# Patient Record
Sex: Male | Born: 1935 | Race: White | Hispanic: No | Marital: Married | Smoking: Former smoker
Health system: Southern US, Community
[De-identification: ages and names within clinical notes are randomized; demographics above are authoritative.]

## PROBLEM LIST (undated history)

## (undated) DIAGNOSIS — F32A Depression, unspecified: Secondary | ICD-10-CM

## (undated) DIAGNOSIS — R2681 Unsteadiness on feet: Secondary | ICD-10-CM

## (undated) DIAGNOSIS — S7292XA Unspecified fracture of left femur, initial encounter for closed fracture: Secondary | ICD-10-CM

## (undated) DIAGNOSIS — K219 Gastro-esophageal reflux disease without esophagitis: Secondary | ICD-10-CM

## (undated) DIAGNOSIS — R279 Unspecified lack of coordination: Secondary | ICD-10-CM

## (undated) DIAGNOSIS — F329 Major depressive disorder, single episode, unspecified: Secondary | ICD-10-CM

## (undated) DIAGNOSIS — M810 Age-related osteoporosis without current pathological fracture: Secondary | ICD-10-CM

## (undated) DIAGNOSIS — K5900 Constipation, unspecified: Secondary | ICD-10-CM

## (undated) DIAGNOSIS — G40909 Epilepsy, unspecified, not intractable, without status epilepticus: Secondary | ICD-10-CM

## (undated) DIAGNOSIS — I639 Cerebral infarction, unspecified: Secondary | ICD-10-CM

## (undated) DIAGNOSIS — B1920 Unspecified viral hepatitis C without hepatic coma: Secondary | ICD-10-CM

## (undated) DIAGNOSIS — A499 Bacterial infection, unspecified: Secondary | ICD-10-CM

## (undated) DIAGNOSIS — M6281 Muscle weakness (generalized): Secondary | ICD-10-CM

## (undated) DIAGNOSIS — N400 Enlarged prostate without lower urinary tract symptoms: Secondary | ICD-10-CM

## (undated) DIAGNOSIS — D62 Acute posthemorrhagic anemia: Secondary | ICD-10-CM

## (undated) DIAGNOSIS — E871 Hypo-osmolality and hyponatremia: Secondary | ICD-10-CM

## (undated) DIAGNOSIS — E785 Hyperlipidemia, unspecified: Secondary | ICD-10-CM

## (undated) DIAGNOSIS — Z1612 Extended spectrum beta lactamase (ESBL) resistance: Secondary | ICD-10-CM

## (undated) DIAGNOSIS — Z8673 Personal history of transient ischemic attack (TIA), and cerebral infarction without residual deficits: Secondary | ICD-10-CM

## (undated) DIAGNOSIS — E46 Unspecified protein-calorie malnutrition: Secondary | ICD-10-CM

## (undated) HISTORY — DX: Hypo-osmolality and hyponatremia: E87.1

## (undated) HISTORY — DX: Age-related osteoporosis without current pathological fracture: M81.0

## (undated) HISTORY — DX: Hyperlipidemia, unspecified: E78.5

## (undated) HISTORY — DX: Gastro-esophageal reflux disease without esophagitis: K21.9

## (undated) HISTORY — DX: Benign prostatic hyperplasia without lower urinary tract symptoms: N40.0

## (undated) HISTORY — DX: Extended spectrum beta lactamase (ESBL) resistance: Z16.12

## (undated) HISTORY — DX: Bacterial infection, unspecified: A49.9

## (undated) HISTORY — DX: Depression, unspecified: F32.A

## (undated) HISTORY — DX: Major depressive disorder, single episode, unspecified: F32.9

## (undated) HISTORY — DX: Acute posthemorrhagic anemia: D62

## (undated) HISTORY — DX: Personal history of transient ischemic attack (TIA), and cerebral infarction without residual deficits: Z86.73

## (undated) HISTORY — DX: Constipation, unspecified: K59.00

## (undated) HISTORY — DX: Unspecified fracture of left femur, initial encounter for closed fracture: S72.92XA

## (undated) HISTORY — DX: Unspecified protein-calorie malnutrition: E46

---

## 2015-10-11 ENCOUNTER — Inpatient Hospital Stay (HOSPITAL_COMMUNITY): Payer: Medicare Other

## 2015-10-11 ENCOUNTER — Inpatient Hospital Stay (HOSPITAL_COMMUNITY): Payer: Medicare Other | Admitting: Anesthesiology

## 2015-10-11 ENCOUNTER — Emergency Department (HOSPITAL_COMMUNITY): Payer: Medicare Other

## 2015-10-11 ENCOUNTER — Encounter (HOSPITAL_COMMUNITY): Payer: Self-pay | Admitting: Emergency Medicine

## 2015-10-11 ENCOUNTER — Encounter (HOSPITAL_COMMUNITY)
Admission: EM | Disposition: A | Discharge status: Skilled Nursing Facility | Payer: Self-pay | Source: Home / Self Care | Attending: Internal Medicine

## 2015-10-11 ENCOUNTER — Inpatient Hospital Stay (HOSPITAL_COMMUNITY)
Admission: EM | Admit: 2015-10-11 | Discharge: 2015-10-16 | DRG: 467 | Disposition: A | Discharge status: Skilled Nursing Facility | Payer: Medicare Other | Attending: Internal Medicine | Admitting: Internal Medicine

## 2015-10-11 ENCOUNTER — Other Ambulatory Visit: Payer: Self-pay | Admitting: Physician Assistant

## 2015-10-11 DIAGNOSIS — I69354 Hemiplegia and hemiparesis following cerebral infarction affecting left non-dominant side: Secondary | ICD-10-CM | POA: Diagnosis not present

## 2015-10-11 DIAGNOSIS — S73005D Unspecified dislocation of left hip, subsequent encounter: Secondary | ICD-10-CM

## 2015-10-11 DIAGNOSIS — Y792 Prosthetic and other implants, materials and accessory orthopedic devices associated with adverse incidents: Secondary | ICD-10-CM | POA: Diagnosis not present

## 2015-10-11 DIAGNOSIS — Z419 Encounter for procedure for purposes other than remedying health state, unspecified: Secondary | ICD-10-CM

## 2015-10-11 DIAGNOSIS — D62 Acute posthemorrhagic anemia: Secondary | ICD-10-CM | POA: Diagnosis not present

## 2015-10-11 DIAGNOSIS — M419 Scoliosis, unspecified: Secondary | ICD-10-CM | POA: Diagnosis present

## 2015-10-11 DIAGNOSIS — Z66 Do not resuscitate: Secondary | ICD-10-CM | POA: Diagnosis present

## 2015-10-11 DIAGNOSIS — G40909 Epilepsy, unspecified, not intractable, without status epilepticus: Secondary | ICD-10-CM | POA: Diagnosis present

## 2015-10-11 DIAGNOSIS — Z87891 Personal history of nicotine dependence: Secondary | ICD-10-CM | POA: Diagnosis not present

## 2015-10-11 DIAGNOSIS — W06XXXA Fall from bed, initial encounter: Secondary | ICD-10-CM | POA: Diagnosis present

## 2015-10-11 DIAGNOSIS — S72002D Fracture of unspecified part of neck of left femur, subsequent encounter for closed fracture with routine healing: Secondary | ICD-10-CM | POA: Diagnosis not present

## 2015-10-11 DIAGNOSIS — T84029A Dislocation of unspecified internal joint prosthesis, initial encounter: Secondary | ICD-10-CM | POA: Diagnosis not present

## 2015-10-11 DIAGNOSIS — M549 Dorsalgia, unspecified: Secondary | ICD-10-CM | POA: Diagnosis present

## 2015-10-11 DIAGNOSIS — Z7982 Long term (current) use of aspirin: Secondary | ICD-10-CM

## 2015-10-11 DIAGNOSIS — F0390 Unspecified dementia without behavioral disturbance: Secondary | ICD-10-CM | POA: Diagnosis present

## 2015-10-11 DIAGNOSIS — S72012A Unspecified intracapsular fracture of left femur, initial encounter for closed fracture: Secondary | ICD-10-CM

## 2015-10-11 DIAGNOSIS — I700 Atherosclerosis of aorta: Secondary | ICD-10-CM | POA: Diagnosis present

## 2015-10-11 DIAGNOSIS — R339 Retention of urine, unspecified: Secondary | ICD-10-CM | POA: Diagnosis not present

## 2015-10-11 DIAGNOSIS — Z8673 Personal history of transient ischemic attack (TIA), and cerebral infarction without residual deficits: Secondary | ICD-10-CM

## 2015-10-11 DIAGNOSIS — B182 Chronic viral hepatitis C: Secondary | ICD-10-CM | POA: Diagnosis present

## 2015-10-11 DIAGNOSIS — M25562 Pain in left knee: Secondary | ICD-10-CM | POA: Diagnosis not present

## 2015-10-11 DIAGNOSIS — B1920 Unspecified viral hepatitis C without hepatic coma: Secondary | ICD-10-CM

## 2015-10-11 DIAGNOSIS — Z96642 Presence of left artificial hip joint: Secondary | ICD-10-CM

## 2015-10-11 DIAGNOSIS — W06XXXD Fall from bed, subsequent encounter: Secondary | ICD-10-CM | POA: Diagnosis not present

## 2015-10-11 DIAGNOSIS — M810 Age-related osteoporosis without current pathological fracture: Secondary | ICD-10-CM | POA: Diagnosis present

## 2015-10-11 DIAGNOSIS — M25552 Pain in left hip: Secondary | ICD-10-CM | POA: Diagnosis present

## 2015-10-11 DIAGNOSIS — S72002A Fracture of unspecified part of neck of left femur, initial encounter for closed fracture: Secondary | ICD-10-CM

## 2015-10-11 HISTORY — DX: Epilepsy, unspecified, not intractable, without status epilepticus: G40.909

## 2015-10-11 HISTORY — PX: TOTAL HIP ARTHROPLASTY: SHX124

## 2015-10-11 HISTORY — DX: Unspecified viral hepatitis C without hepatic coma: B19.20

## 2015-10-11 HISTORY — DX: Cerebral infarction, unspecified: I63.9

## 2015-10-11 LAB — PROTIME-INR
INR: 1.34 (ref 0.00–1.49)
Prothrombin Time: 16.7 seconds — ABNORMAL HIGH (ref 11.6–15.2)

## 2015-10-11 LAB — CBC WITH DIFFERENTIAL/PLATELET
BASOS ABS: 0 10*3/uL (ref 0.0–0.1)
BASOS PCT: 1 %
Eosinophils Absolute: 0.2 10*3/uL (ref 0.0–0.7)
Eosinophils Relative: 4 %
HCT: 37.6 % — ABNORMAL LOW (ref 39.0–52.0)
HEMOGLOBIN: 12.9 g/dL — AB (ref 13.0–17.0)
LYMPHS PCT: 46 %
Lymphs Abs: 2.5 10*3/uL (ref 0.7–4.0)
MCH: 32.3 pg (ref 26.0–34.0)
MCHC: 34.3 g/dL (ref 30.0–36.0)
MCV: 94.2 fL (ref 78.0–100.0)
MONO ABS: 0.6 10*3/uL (ref 0.1–1.0)
Monocytes Relative: 10 %
NEUTROS ABS: 2 10*3/uL (ref 1.7–7.7)
NEUTROS PCT: 39 %
Platelets: 150 10*3/uL (ref 150–400)
RBC: 3.99 MIL/uL — AB (ref 4.22–5.81)
RDW: 13.7 % (ref 11.5–15.5)
WBC: 5.3 10*3/uL (ref 4.0–10.5)

## 2015-10-11 LAB — BASIC METABOLIC PANEL
ANION GAP: 9 (ref 5–15)
BUN: 19 mg/dL (ref 6–20)
CALCIUM: 9.2 mg/dL (ref 8.9–10.3)
CO2: 24 mmol/L (ref 22–32)
Chloride: 102 mmol/L (ref 101–111)
Creatinine, Ser: 1.2 mg/dL (ref 0.61–1.24)
GFR, EST NON AFRICAN AMERICAN: 56 mL/min — AB (ref >60)
GLUCOSE: 107 mg/dL — AB (ref 65–99)
Potassium: 3.9 mmol/L (ref 3.5–5.1)
Sodium: 135 mmol/L (ref 135–145)

## 2015-10-11 LAB — TYPE AND SCREEN
ABO/RH(D): B POS
ANTIBODY SCREEN: NEGATIVE

## 2015-10-11 LAB — SURGICAL PCR SCREEN
MRSA, PCR: NEGATIVE
Staphylococcus aureus: NEGATIVE

## 2015-10-11 LAB — ABO/RH: ABO/RH(D): B POS

## 2015-10-11 LAB — HEPATIC FUNCTION PANEL
ALBUMIN: 3.1 g/dL — AB (ref 3.5–5.0)
ALT: 42 U/L (ref 17–63)
AST: 53 U/L — AB (ref 15–41)
Alkaline Phosphatase: 76 U/L (ref 38–126)
BILIRUBIN TOTAL: 1.3 mg/dL — AB (ref 0.3–1.2)
Bilirubin, Direct: 0.4 mg/dL (ref 0.1–0.5)
Indirect Bilirubin: 0.9 mg/dL (ref 0.3–0.9)
Total Protein: 6.7 g/dL (ref 6.5–8.1)

## 2015-10-11 SURGERY — ARTHROPLASTY, HIP, TOTAL, ANTERIOR APPROACH
Anesthesia: General | Site: Hip | Laterality: Left

## 2015-10-11 MED ORDER — PROPOFOL 1000 MG/100ML IV EMUL
INTRAVENOUS | Status: AC
  Filled 2015-10-11: qty 200

## 2015-10-11 MED ORDER — CEFAZOLIN SODIUM-DEXTROSE 2-4 GM/100ML-% IV SOLN
2.0000 g | Freq: Four times a day (QID) | INTRAVENOUS | Status: AC
  Administered 2015-10-11 – 2015-10-12 (×2): 2 g via INTRAVENOUS
  Filled 2015-10-11 (×2): qty 100

## 2015-10-11 MED ORDER — LACTATED RINGERS IV SOLN
INTRAVENOUS | Status: DC | PRN
  Administered 2015-10-11 (×2): via INTRAVENOUS

## 2015-10-11 MED ORDER — ATROPINE SULFATE 0.4 MG/ML IV SOSY
PREFILLED_SYRINGE | INTRAVENOUS | Status: AC
  Filled 2015-10-11: qty 2.5

## 2015-10-11 MED ORDER — CEFAZOLIN SODIUM-DEXTROSE 2-4 GM/100ML-% IV SOLN
2.0000 g | INTRAVENOUS | Status: AC
  Administered 2015-10-11: 2 g via INTRAVENOUS
  Filled 2015-10-11: qty 100

## 2015-10-11 MED ORDER — ONDANSETRON HCL 4 MG PO TABS
4.0000 mg | ORAL_TABLET | Freq: Four times a day (QID) | ORAL | Status: DC | PRN
  Administered 2015-10-14: 4 mg via ORAL
  Filled 2015-10-11: qty 1

## 2015-10-11 MED ORDER — ROCURONIUM BROMIDE 50 MG/5ML IV SOLN
INTRAVENOUS | Status: AC
  Filled 2015-10-11: qty 1

## 2015-10-11 MED ORDER — ENSURE ENLIVE PO LIQD
237.0000 mL | Freq: Every day | ORAL | Status: DC
  Administered 2015-10-14 – 2015-10-16 (×3): 237 mL via ORAL

## 2015-10-11 MED ORDER — TOPIRAMATE 25 MG PO TABS
50.0000 mg | ORAL_TABLET | Freq: Two times a day (BID) | ORAL | Status: DC
  Administered 2015-10-11 – 2015-10-16 (×11): 50 mg via ORAL
  Filled 2015-10-11 (×12): qty 2

## 2015-10-11 MED ORDER — METHOCARBAMOL 1000 MG/10ML IJ SOLN
500.0000 mg | Freq: Four times a day (QID) | INTRAVENOUS | Status: DC | PRN
  Filled 2015-10-11: qty 5

## 2015-10-11 MED ORDER — FENTANYL CITRATE (PF) 100 MCG/2ML IJ SOLN
25.0000 ug | INTRAMUSCULAR | Status: DC | PRN
  Administered 2015-10-11 (×2): 50 ug via INTRAVENOUS

## 2015-10-11 MED ORDER — PHENYLEPHRINE 40 MCG/ML (10ML) SYRINGE FOR IV PUSH (FOR BLOOD PRESSURE SUPPORT)
PREFILLED_SYRINGE | INTRAVENOUS | Status: AC
  Filled 2015-10-11: qty 10

## 2015-10-11 MED ORDER — SODIUM CHLORIDE 0.9% FLUSH
3.0000 mL | Freq: Two times a day (BID) | INTRAVENOUS | Status: DC
  Administered 2015-10-13 – 2015-10-16 (×6): 3 mL via INTRAVENOUS

## 2015-10-11 MED ORDER — SODIUM CHLORIDE 0.9 % IV SOLN
INTRAVENOUS | Status: DC
Start: 2015-10-11 — End: 2015-10-14
  Administered 2015-10-11 – 2015-10-12 (×2): via INTRAVENOUS

## 2015-10-11 MED ORDER — 0.9 % SODIUM CHLORIDE (POUR BTL) OPTIME
TOPICAL | Status: DC | PRN
  Administered 2015-10-11: 1000 mL

## 2015-10-11 MED ORDER — OXYCODONE-ACETAMINOPHEN 5-325 MG PO TABS
1.0000 | ORAL_TABLET | Freq: Once | ORAL | Status: AC
  Administered 2015-10-11: 1 via ORAL
  Filled 2015-10-11: qty 1

## 2015-10-11 MED ORDER — PROPOFOL 10 MG/ML IV BOLUS
INTRAVENOUS | Status: AC
Start: 2015-10-11 — End: 2015-10-11
  Filled 2015-10-11: qty 20

## 2015-10-11 MED ORDER — MENTHOL 3 MG MT LOZG: 1.0000 | LOZENGE | OROMUCOSAL | Status: DC | PRN

## 2015-10-11 MED ORDER — ASPIRIN EC 325 MG PO TBEC
325.0000 mg | DELAYED_RELEASE_TABLET | Freq: Every day | ORAL | Status: DC
  Administered 2015-10-12: 325 mg via ORAL
  Filled 2015-10-11: qty 1

## 2015-10-11 MED ORDER — PHENOL 1.4 % MT LIQD: 1.0000 | OROMUCOSAL | Status: DC | PRN

## 2015-10-11 MED ORDER — OXYCODONE-ACETAMINOPHEN 5-325 MG PO TABS: 1.0000 | ORAL_TABLET | ORAL | Status: DC | PRN

## 2015-10-11 MED ORDER — ONDANSETRON HCL 4 MG/2ML IJ SOLN: 4.0000 mg | Freq: Four times a day (QID) | INTRAMUSCULAR | Status: DC | PRN

## 2015-10-11 MED ORDER — HYDROCODONE-ACETAMINOPHEN 5-325 MG PO TABS
1.0000 | ORAL_TABLET | Freq: Four times a day (QID) | ORAL | Status: DC | PRN
  Administered 2015-10-11 – 2015-10-14 (×8): 2 via ORAL
  Filled 2015-10-11 (×8): qty 2

## 2015-10-11 MED ORDER — POVIDONE-IODINE 10 % EX SWAB
2.0000 "application " | Freq: Once | CUTANEOUS | Status: AC
  Administered 2015-10-11: 2 via TOPICAL

## 2015-10-11 MED ORDER — CITALOPRAM HYDROBROMIDE 10 MG PO TABS
10.0000 mg | ORAL_TABLET | Freq: Every day | ORAL | Status: DC
  Administered 2015-10-12 – 2015-10-15 (×4): 10 mg via ORAL
  Filled 2015-10-11 (×5): qty 1

## 2015-10-11 MED ORDER — MORPHINE SULFATE (PF) 2 MG/ML IV SOLN
0.5000 mg | INTRAVENOUS | Status: DC | PRN
  Administered 2015-10-12 (×2): 0.5 mg via INTRAVENOUS
  Filled 2015-10-11 (×2): qty 1

## 2015-10-11 MED ORDER — GLYCOPYRROLATE 0.2 MG/ML IJ SOLN
INTRAMUSCULAR | Status: DC | PRN
  Administered 2015-10-11: 0.6 mg via INTRAVENOUS

## 2015-10-11 MED ORDER — ZOLPIDEM TARTRATE 5 MG PO TABS
5.0000 mg | ORAL_TABLET | Freq: Every evening | ORAL | Status: DC | PRN
  Administered 2015-10-13 – 2015-10-14 (×2): 5 mg via ORAL
  Filled 2015-10-11 (×2): qty 1

## 2015-10-11 MED ORDER — FENTANYL CITRATE (PF) 100 MCG/2ML IJ SOLN
INTRAMUSCULAR | Status: AC
  Filled 2015-10-11: qty 2

## 2015-10-11 MED ORDER — MEPERIDINE HCL 25 MG/ML IJ SOLN: 6.2500 mg | INTRAMUSCULAR | Status: DC | PRN

## 2015-10-11 MED ORDER — FENTANYL CITRATE (PF) 250 MCG/5ML IJ SOLN
INTRAMUSCULAR | Status: AC
  Filled 2015-10-11: qty 5

## 2015-10-11 MED ORDER — ACETAMINOPHEN 325 MG PO TABS
650.0000 mg | ORAL_TABLET | Freq: Four times a day (QID) | ORAL | Status: DC | PRN
  Administered 2015-10-14: 650 mg via ORAL
  Filled 2015-10-11: qty 2

## 2015-10-11 MED ORDER — ACETAMINOPHEN 650 MG RE SUPP: 650.0000 mg | Freq: Four times a day (QID) | RECTAL | Status: DC | PRN

## 2015-10-11 MED ORDER — ACETAMINOPHEN 325 MG PO TABS: 650.0000 mg | ORAL_TABLET | Freq: Four times a day (QID) | ORAL | Status: DC | PRN

## 2015-10-11 MED ORDER — FENTANYL CITRATE (PF) 100 MCG/2ML IJ SOLN
INTRAMUSCULAR | Status: DC | PRN
  Administered 2015-10-11 (×2): 50 ug via INTRAVENOUS
  Administered 2015-10-11: 100 ug via INTRAVENOUS

## 2015-10-11 MED ORDER — NEOSTIGMINE METHYLSULFATE 10 MG/10ML IV SOLN
INTRAVENOUS | Status: DC | PRN
  Administered 2015-10-11: 5 mg via INTRAVENOUS

## 2015-10-11 MED ORDER — METOCLOPRAMIDE HCL 5 MG/ML IJ SOLN: 5.0000 mg | Freq: Three times a day (TID) | INTRAMUSCULAR | Status: DC | PRN

## 2015-10-11 MED ORDER — LACTATED RINGERS IV SOLN
INTRAVENOUS | Status: DC
  Administered 2015-10-11: 17:00:00 via INTRAVENOUS

## 2015-10-11 MED ORDER — ROCURONIUM BROMIDE 100 MG/10ML IV SOLN
INTRAVENOUS | Status: DC | PRN
  Administered 2015-10-11: 30 mg via INTRAVENOUS
  Administered 2015-10-11: 20 mg via INTRAVENOUS

## 2015-10-11 MED ORDER — METHOCARBAMOL 500 MG PO TABS
500.0000 mg | ORAL_TABLET | Freq: Four times a day (QID) | ORAL | Status: DC | PRN
  Administered 2015-10-11 – 2015-10-16 (×9): 500 mg via ORAL
  Filled 2015-10-11 (×9): qty 1

## 2015-10-11 MED ORDER — METOCLOPRAMIDE HCL 5 MG PO TABS: 5.0000 mg | ORAL_TABLET | Freq: Three times a day (TID) | ORAL | Status: DC | PRN

## 2015-10-11 MED ORDER — PROPOFOL 10 MG/ML IV BOLUS
INTRAVENOUS | Status: DC | PRN
  Administered 2015-10-11: 100 mg via INTRAVENOUS

## 2015-10-11 MED ORDER — PHENYLEPHRINE HCL 10 MG/ML IJ SOLN
INTRAMUSCULAR | Status: DC | PRN
  Administered 2015-10-11 (×3): 80 ug via INTRAVENOUS

## 2015-10-11 MED ORDER — CHLORHEXIDINE GLUCONATE 4 % EX LIQD: 60.0000 mL | Freq: Once | CUTANEOUS | Status: DC

## 2015-10-11 MED ORDER — SODIUM CHLORIDE 0.9% FLUSH: 3.0000 mL | INTRAVENOUS | Status: DC | PRN

## 2015-10-11 MED ORDER — ATORVASTATIN CALCIUM 10 MG PO TABS
10.0000 mg | ORAL_TABLET | Freq: Every day | ORAL | Status: DC
  Administered 2015-10-11 – 2015-10-15 (×5): 10 mg via ORAL
  Filled 2015-10-11 (×5): qty 1

## 2015-10-11 MED ORDER — SODIUM CHLORIDE 0.9 % IR SOLN
Status: DC | PRN
  Administered 2015-10-11: 3000 mL

## 2015-10-11 MED ORDER — ONDANSETRON HCL 4 MG/2ML IJ SOLN
INTRAMUSCULAR | Status: DC | PRN
  Administered 2015-10-11: 4 mg via INTRAVENOUS

## 2015-10-11 MED ORDER — TAMSULOSIN HCL 0.4 MG PO CAPS
0.4000 mg | ORAL_CAPSULE | Freq: Every day | ORAL | Status: DC
  Administered 2015-10-12 – 2015-10-15 (×4): 0.4 mg via ORAL
  Filled 2015-10-11 (×4): qty 1

## 2015-10-11 SURGICAL SUPPLY — 52 items
BENZOIN TINCTURE PRP APPL 2/3 (GAUZE/BANDAGES/DRESSINGS) ×3 IMPLANT
BLADE SAW SGTL 18X1.27X75 (BLADE) ×2 IMPLANT
BLADE SAW SGTL 18X1.27X75MM (BLADE) ×1
BLADE SURG ROTATE 9660 (MISCELLANEOUS) IMPLANT
CAPT HIP TOTAL 2 ×3 IMPLANT
CELLS DAT CNTRL 66122 CELL SVR (MISCELLANEOUS) ×1 IMPLANT
CLOSURE WOUND 1/2 X4 (GAUZE/BANDAGES/DRESSINGS) ×2
COVER SURGICAL LIGHT HANDLE (MISCELLANEOUS) ×3 IMPLANT
DRAPE C-ARM 42X72 X-RAY (DRAPES) ×3 IMPLANT
DRAPE STERI IOBAN 125X83 (DRAPES) ×3 IMPLANT
DRAPE U-SHAPE 47X51 STRL (DRAPES) ×9 IMPLANT
DRESSING ALLEVYN LIFE SACRUM (GAUZE/BANDAGES/DRESSINGS) ×3 IMPLANT
DRSG AQUACEL AG ADV 3.5X10 (GAUZE/BANDAGES/DRESSINGS) ×3 IMPLANT
DURAPREP 26ML APPLICATOR (WOUND CARE) ×3 IMPLANT
ELECT BLADE 4.0 EZ CLEAN MEGAD (MISCELLANEOUS) ×3
ELECT BLADE 6.5 EXT (BLADE) IMPLANT
ELECT REM PT RETURN 9FT ADLT (ELECTROSURGICAL) ×3
ELECTRODE BLDE 4.0 EZ CLN MEGD (MISCELLANEOUS) ×1 IMPLANT
ELECTRODE REM PT RTRN 9FT ADLT (ELECTROSURGICAL) ×1 IMPLANT
FACESHIELD WRAPAROUND (MASK) ×6 IMPLANT
GAUZE XEROFORM 1X8 LF (GAUZE/BANDAGES/DRESSINGS) ×3 IMPLANT
GLOVE BIOGEL PI IND STRL 8 (GLOVE) ×2 IMPLANT
GLOVE BIOGEL PI INDICATOR 8 (GLOVE) ×4
GLOVE ECLIPSE 8.0 STRL XLNG CF (GLOVE) ×3 IMPLANT
GLOVE ORTHO TXT STRL SZ7.5 (GLOVE) ×3 IMPLANT
GOWN STRL REUS W/ TWL LRG LVL3 (GOWN DISPOSABLE) ×2 IMPLANT
GOWN STRL REUS W/ TWL XL LVL3 (GOWN DISPOSABLE) ×2 IMPLANT
GOWN STRL REUS W/TWL LRG LVL3 (GOWN DISPOSABLE) ×4
GOWN STRL REUS W/TWL XL LVL3 (GOWN DISPOSABLE) ×4
HANDPIECE INTERPULSE COAX TIP (DISPOSABLE) ×2
KIT BASIN OR (CUSTOM PROCEDURE TRAY) ×3 IMPLANT
KIT ROOM TURNOVER OR (KITS) ×3 IMPLANT
MANIFOLD NEPTUNE II (INSTRUMENTS) ×3 IMPLANT
NS IRRIG 1000ML POUR BTL (IV SOLUTION) ×3 IMPLANT
PACK TOTAL JOINT (CUSTOM PROCEDURE TRAY) ×3 IMPLANT
PAD ARMBOARD 7.5X6 YLW CONV (MISCELLANEOUS) ×3 IMPLANT
RTRCTR WOUND ALEXIS 18CM MED (MISCELLANEOUS) ×3
SET HNDPC FAN SPRY TIP SCT (DISPOSABLE) ×1 IMPLANT
SPONGE LAP 18X18 X RAY DECT (DISPOSABLE) ×3 IMPLANT
STAPLER VISISTAT 35W (STAPLE) IMPLANT
STRIP CLOSURE SKIN 1/2X4 (GAUZE/BANDAGES/DRESSINGS) ×4 IMPLANT
SUT ETHIBOND NAB CT1 #1 30IN (SUTURE) ×3 IMPLANT
SUT MNCRL AB 4-0 PS2 18 (SUTURE) ×3 IMPLANT
SUT VIC AB 0 CT1 27 (SUTURE) ×2
SUT VIC AB 0 CT1 27XBRD ANBCTR (SUTURE) ×1 IMPLANT
SUT VIC AB 1 CT1 27 (SUTURE) ×2
SUT VIC AB 1 CT1 27XBRD ANBCTR (SUTURE) ×1 IMPLANT
SUT VIC AB 2-0 CT1 27 (SUTURE) ×2
SUT VIC AB 2-0 CT1 TAPERPNT 27 (SUTURE) ×1 IMPLANT
TOWEL OR 17X24 6PK STRL BLUE (TOWEL DISPOSABLE) ×3 IMPLANT
TOWEL OR 17X26 10 PK STRL BLUE (TOWEL DISPOSABLE) ×3 IMPLANT
TRAY FOLEY CATH 16FRSI W/METER (SET/KITS/TRAYS/PACK) IMPLANT

## 2015-10-11 NOTE — ED Notes (Signed)
[phone]. [id number]

## 2015-10-11 NOTE — ED Notes (Signed)
RN spoke with Dr. August Saucerean at [facility]. RN let him know daughter requesting Dr. Magnus IvanBlackman since she has history with him. He stated he let her know.

## 2015-10-11 NOTE — ED Provider Notes (Signed)
CSN: [id number]     Arrival date & time 10/11/15  0450 History   First MD Initiated Contact with Patient 10/11/15 0501     Chief Complaint  Patient presents with  . Fall     (Consider location/radiation/quality/duration/timing/severity/associated sxs/prior Treatment) Patient is a 81 y.o. male presenting with fall. The history is provided by the patient.  Fall  He was transferred here from a nursing home where he was reported to fallen out of bed. Patient states that he hit his head and is complaining of feeling dizzy and having head pain and neck pain. He is also complaining of pain in both of his legs. He states that he is actually at the nursing home for rehabilitation because of problems with his legs. Patient states that he asked she had a seizure. He denies nausea or vomiting. He states that [place] and [place] not [place] a physician here.  Past Medical History  Diagnosis Date  . Stroke [facility])    History reviewed. No pertinent past surgical history. History reviewed. No pertinent family history. Social History  Substance Use Topics  . Smoking status: Former Games developer  . Smokeless tobacco: None  . Alcohol Use: No    Review of Systems  All other systems reviewed and are negative.     Allergies  Review of patient's allergies indicates no known allergies.  Home Medications   Prior to Admission medications   Not on File   BP 128/90 mmHg  Pulse 85  Temp(Src) 97.6 F (36.4 C) (Oral)  Resp 18  SpO2 100% Physical Exam  Nursing note and vitals reviewed.  81 year old male, resting comfortably and in no acute distress. Vital signs are normal. Oxygen saturation is 100%, which is normal. Head is normocephalic and atraumatic. PERRLA, EOMI. Oropharynx is clear. Neck is nontender without adenopathy or JVD. Back is nontender and there is no CVA tenderness. Lungs are clear without rales, wheezes, or rhonchi. Chest is nontender. Heart has regular rate  and rhythm without murmur. Abdomen is soft, flat, nontender without masses or hepatosplenomegaly and peristalsis is normoactive. Extremities have no cyanosis or edema. While there is no tenderness on palpation of the hips, legs are rigid and range of motion is markedly decreased. No leg shortening or external or internal rotation. No obvious acute extremity injury. Skin is warm and dry without rash. Neurologic: Mental status is normal, cranial nerves are intact, there are no motor or sensory deficits.  ED Course  Procedures (including critical care time) Labs Review Results for orders placed or performed during the hospital encounter of 10/11/15  Basic metabolic panel  Result Value Ref Range   Sodium 135 135 - 145 mmol/L   Potassium 3.9 3.5 - 5.1 mmol/L   Chloride 102 101 - 111 mmol/L   CO2 24 22 - 32 mmol/L   Glucose, Bld 107 (H) 65 - 99 mg/dL   BUN 19 6 - 20 mg/dL   Creatinine, Ser 4.09 0.61 - 1.24 mg/dL   Calcium 9.2 8.9 - 81.1 mg/dL   GFR calc non Af Amer 56 (L) >60 mL/min   GFR calc Af Amer >60 >60 mL/min   Anion gap 9 5 - 15  CBC with Differential  Result Value Ref Range   WBC 5.3 4.0 - 10.5 K/uL   RBC 3.99 (L) 4.22 - 5.81 MIL/uL   Hemoglobin 12.9 (L) 13.0 - 17.0 g/dL   HCT 91.4 (L) 78.2 - 95.6 %   MCV  94.2 78.0 - 100.0 fL   MCH 32.3 26.0 - 34.0 pg   MCHC 34.3 30.0 - 36.0 g/dL   RDW 16.113.7 09.611.5 - 04.515.5 %   Platelets 150 150 - 400 K/uL   Neutrophils Relative % 39 %   Neutro Abs 2.0 1.7 - 7.7 K/uL   Lymphocytes Relative 46 %   Lymphs Abs 2.5 0.7 - 4.0 K/uL   Monocytes Relative 10 %   Monocytes Absolute 0.6 0.1 - 1.0 K/uL   Eosinophils Relative 4 %   Eosinophils Absolute 0.2 0.0 - 0.7 K/uL   Basophils Relative 1 %   Basophils Absolute 0.0 0.0 - 0.1 K/uL    Imaging Review Ct Head Wo Contrast  10/11/2015  CLINICAL DATA:  Patient fell out of bed. Now with head and neck pain. EXAM: CT HEAD WITHOUT CONTRAST CT CERVICAL SPINE WITHOUT CONTRAST TECHNIQUE: Multidetector CT  imaging of the head and cervical spine was performed following the standard protocol without intravenous contrast. Multiplanar CT image reconstructions of the cervical spine were also generated. COMPARISON:  None. FINDINGS: CT HEAD FINDINGS Diffuse cerebral atrophy. Ventricular dilatation consistent with central atrophy. Low-attenuation changes in the deep white matter consistent with small vessel ischemia. Prominent vascular calcifications. No mass effect or midline shift. No abnormal extra-axial fluid collections. Gray-white matter junctions are distinct. Basal cisterns are not effaced. No evidence of acute intracranial hemorrhage. No depressed skull fractures. Mucosal thickening in the right maxillary antrum. Mastoid air cells are not opacified. CT CERVICAL SPINE FINDINGS There is reversal of the usual cervical lordosis without anterior subluxation. This may be due to patient positioning or degenerative change but ligamentous injury or muscle spasm can also have this appearance and are not excluded. No vertebral compression deformities. Degenerative changes throughout the cervical spine with narrowed interspaces and endplate hypertrophic changes. Degenerative changes in the facet joints. Uncovertebral and facet joint spurring causes encroachment upon the neural foramina at multiple levels bilaterally. No prevertebral soft tissue swelling. No vertebral compression deformities. C1-2 articulation appears intact. Sclerosis in the right pedicle of C2 is likely to represent a benign bone island. Soft tissues are unremarkable. Vascular calcifications. IMPRESSION: No acute intracranial abnormalities. Prominent chronic atrophy and small vessel ischemic changes. Nonspecific reversal of the usual cervical lordosis. Diffuse degenerative changes in the cervical spine. No acute displaced fractures identified. Electronically Signed   By: Burman NievesWilliam  Stevens M.D.   On: 10/11/2015 06:34   Ct Cervical Spine Wo Contrast  10/11/2015   CLINICAL DATA:  Patient fell out of bed. Now with head and neck pain. EXAM: CT HEAD WITHOUT CONTRAST CT CERVICAL SPINE WITHOUT CONTRAST TECHNIQUE: Multidetector CT imaging of the head and cervical spine was performed following the standard protocol without intravenous contrast. Multiplanar CT image reconstructions of the cervical spine were also generated. COMPARISON:  None. FINDINGS: CT HEAD FINDINGS Diffuse cerebral atrophy. Ventricular dilatation consistent with central atrophy. Low-attenuation changes in the deep white matter consistent with small vessel ischemia. Prominent vascular calcifications. No mass effect or midline shift. No abnormal extra-axial fluid collections. Gray-white matter junctions are distinct. Basal cisterns are not effaced. No evidence of acute intracranial hemorrhage. No depressed skull fractures. Mucosal thickening in the right maxillary antrum. Mastoid air cells are not opacified. CT CERVICAL SPINE FINDINGS There is reversal of the usual cervical lordosis without anterior subluxation. This may be due to patient positioning or degenerative change but ligamentous injury or muscle spasm can also have this appearance and are not excluded. No vertebral compression deformities. Degenerative changes throughout  the cervical spine with narrowed interspaces and endplate hypertrophic changes. Degenerative changes in the facet joints. Uncovertebral and facet joint spurring causes encroachment upon the neural foramina at multiple levels bilaterally. No prevertebral soft tissue swelling. No vertebral compression deformities. C1-2 articulation appears intact. Sclerosis in the right pedicle of C2 is likely to represent a benign bone island. Soft tissues are unremarkable. Vascular calcifications. IMPRESSION: No acute intracranial abnormalities. Prominent chronic atrophy and small vessel ischemic changes. Nonspecific reversal of the usual cervical lordosis. Diffuse degenerative changes in the cervical  spine. No acute displaced fractures identified. Electronically Signed   By: Burman Nieves M.D.   On: 10/11/2015 06:34   Dg Hips Bilat With Pelvis 3-4 Views  10/11/2015  CLINICAL DATA:  Initial evaluation for acute seizure, fall. EXAM: DG HIP (WITH OR WITHOUT PELVIS) 3-4V BILAT COMPARISON:  None. FINDINGS: Bones are diffusely osteopenia, somewhat limiting evaluation. There is an acute subcapital fracture of through the left femoral neck with minimal displacement and slight impaction. Femoral head itself remains position within the acetabulum. Femoral head height preserved. Remainder of the visualized left femoral shaft intact. Bony acetabulum on the left is intact. Right hip intact. Right femoral head normally position within the acetabulum. Right femoral head and neck intact. Visualized proximal right femoral shaft intact. Visualized bony pelvis intact.  SI joints approximated. Mild degenerative changes noted within the lower lumbar spine. No acute soft tissue abnormality.  Vascular calcifications noted. IMPRESSION: 1. Acute subcapital fracture of the proximal left femoral neck with slight displacement and impaction. 2. No acute fracture or dislocation about the right hip. 3. Diffuse osteopenia. Electronically Signed   By: Rise Mu M.D.   On: 10/11/2015 06:38   I have personally reviewed and evaluated these images and lab results as part of my medical decision-making.    MDM   Final diagnoses:  Fall from bed, initial encounter  Subcapital fracture of hip, left, closed, initial encounter (HCC)    Fall from bed without evidence of significant injury. His leg findings. To be related to why he is in the nursing home. However, he will get screening x-rays of his hips as well CT of head and cervical spine. Screening labs are obtained as well. He has no prior records and the [facility] system.  CT scans of head and cervical spine were unremarkable. X-rays of the hips show subcapital  fracture of the left hip. He will need to be admitted to internal medicine with orthopedic consultation.  Dione Booze, MD 10/11/15 [phone]

## 2015-10-11 NOTE — H&P (Signed)
Date: 10/11/2015               Patient Name:  Randall Hoffman MRN: [id number]  DOB: 03/07/1935 Age / Sex: 81 y.o., male   PCP: No primary care provider on file.         Medical Service: Internal Medicine Teaching Service         Attending Physician: Dr. Larey Dresser    First Contact: Dr. Burgess Estelle Pager: [phone]  Second Contact: Dr. Jacques Earthly Pager: [phone]       After Hours (After 5p/  First Contact Pager: [phone]  weekends / holidays): Second Contact Pager: [phone]   Chief Complaint: hip pain s/p fall  History of Present Illness: 81 year old male with hx of CVA, seizure disorder, dementia and HCV (reportedly w/ cirrhosis) here from SNF with c/o hip pain s/p fall from bed last night.  The patient recently arrived in [place] from [place] where he has been living the past 40 years.  Reportedly, he was very ill in the past few weeks and required hospital admission.  Patient reports LE weakness, decreased appetite, increased fatigue and increased confusion in recent weeks.  He remembers being in bed last night and had an episode of fecal incontinence, which he says is not unusual for him.  He felt like he was dreaming and rolled out of the bed onto the floor.  He hit his head on hard floor but denies LOC.  He does not remember any tonic-clonic movements.  He cannot remember when his last seizure was.  EMS was called and attempted to bring him to ED but he declined thinking that his injuries were not that bad.  He developed worsening pain so eventually was transported to ED.  He reports using a cane to ambulate but otherwise lives in his own apartment in [place] and takes care of his own ADLs.  He uses the bus for transportation because he cannot drive given seizure history.  He ambulates at home and to and from bus stop but is not very active.  Patient agreeable to me discussing medical hx with his daughter.  I spoke to her by phone and she reports he has had 2 recent CVAs in past 2  months and was at [facility] in [place], [place] for the past 6 weeks.  He has had difficulty ambulating during this time.       Meds: No current facility-administered medications for this encounter.   Current Outpatient Prescriptions  Medication Sig Dispense Refill  . acetaminophen (TYLENOL) 500 MG tablet Take 1,000 mg by mouth 2 (two) times daily.    Marland Kitchen aspirin 81 MG chewable tablet Chew 81 mg by mouth daily.    Marland Kitchen atorvastatin (LIPITOR) 10 MG tablet Take 10 mg by mouth daily.    . citalopram (CELEXA) 10 MG tablet Take 10 mg by mouth daily.    Marland Kitchen ENSURE (ENSURE) Take 237 mLs by mouth daily.    . tamsulosin (FLOMAX) 0.4 MG CAPS capsule Take 0.4 mg by mouth at bedtime.    . topiramate (TOPAMAX) 50 MG tablet Take 50 mg by mouth 2 (two) times daily.      Allergies: Allergies as of 10/11/2015  . (No Known Allergies)   Past Medical History  Diagnosis Date  . Stroke [facility])    History reviewed. No pertinent past surgical history. History reviewed. No pertinent family history. Social History   Social History  . Marital Status: Married    Spouse Name:  N/A  . Number of Children: N/A  . Years of Education: N/A   Occupational History  . Not on file.   Social History Main Topics  . Smoking status: Former Research scientist (life sciences)  . Smokeless tobacco: Not on file  . Alcohol Use: No  . Drug Use: No  . Sexual Activity: No   Other Topics Concern  . Not on file   Social History Narrative  . No narrative on file    Review of Systems: General:  Denies fever, fatigue or unexplained weight loss Heart:  Denies chest pain, orthopnea or LE edema; occasional palpitations Pulm:  Denies dyspnea GI:  Denies abdominal pain, N/V or diarrhea; + fecal incontinence GU:  Denies difficulty urinating, dysuria or hematuria Neuro:  Per HPI, + numbness/tinlging feet  Physical Exam: Blood pressure 99/62, pulse 70, temperature 97.6 F (36.4 C), temperature source Oral, resp. rate 20, SpO2 97 %. General:  resting in bed in NAD, pleasant and cooperative HEENT: PERRL, EOMI, no scleral icterus, poor dentition Cardiac: RRR, no rubs, murmurs or gallops; no carotid bruits, no JVD; peripheral pulses equal and intact B/L Pulm: clear to auscultation bilaterally, moving normal volumes of air Abd: soft, nontender, nondistended, BS present Ext: warm and well perfused, no pedal edema Neuro: alert and oriented X3, cranial nerves II-XII grossly intact, 5/5 MMS upper extremities, 4+/5 MMS lower extremities, sensation grossly intact B/L  Lab results: Basic Metabolic Panel:  Recent Labs  10/11/15 0541  NA 135  K 3.9  CL 102  CO2 24  GLUCOSE 107*  BUN 19  CREATININE 1.20  CALCIUM 9.2   CBC:  Recent Labs  10/11/15 0541  WBC 5.3  NEUTROABS 2.0  HGB 12.9*  HCT 37.6*  MCV 94.2  PLT 150   Imaging results:  Ct Head Wo Contrast  10/11/2015  CLINICAL DATA:  Patient fell out of bed. Now with head and neck pain. EXAM: CT HEAD WITHOUT CONTRAST CT CERVICAL SPINE WITHOUT CONTRAST TECHNIQUE: Multidetector CT imaging of the head and cervical spine was performed following the standard protocol without intravenous contrast. Multiplanar CT image reconstructions of the cervical spine were also generated. COMPARISON:  None. FINDINGS: CT HEAD FINDINGS Diffuse cerebral atrophy. Ventricular dilatation consistent with central atrophy. Low-attenuation changes in the deep white matter consistent with small vessel ischemia. Prominent vascular calcifications. No mass effect or midline shift. No abnormal extra-axial fluid collections. Gray-white matter junctions are distinct. Basal cisterns are not effaced. No evidence of acute intracranial hemorrhage. No depressed skull fractures. Mucosal thickening in the right maxillary antrum. Mastoid air cells are not opacified. CT CERVICAL SPINE FINDINGS There is reversal of the usual cervical lordosis without anterior subluxation. This may be due to patient positioning or degenerative  change but ligamentous injury or muscle spasm can also have this appearance and are not excluded. No vertebral compression deformities. Degenerative changes throughout the cervical spine with narrowed interspaces and endplate hypertrophic changes. Degenerative changes in the facet joints. Uncovertebral and facet joint spurring causes encroachment upon the neural foramina at multiple levels bilaterally. No prevertebral soft tissue swelling. No vertebral compression deformities. C1-2 articulation appears intact. Sclerosis in the right pedicle of C2 is likely to represent a benign bone island. Soft tissues are unremarkable. Vascular calcifications. IMPRESSION: No acute intracranial abnormalities. Prominent chronic atrophy and small vessel ischemic changes. Nonspecific reversal of the usual cervical lordosis. Diffuse degenerative changes in the cervical spine. No acute displaced fractures identified. Electronically Signed   By: Lucienne Capers M.D.   On: 10/11/2015 06:34  Ct Cervical Spine Wo Contrast  10/11/2015  CLINICAL DATA:  Patient fell out of bed. Now with head and neck pain. EXAM: CT HEAD WITHOUT CONTRAST CT CERVICAL SPINE WITHOUT CONTRAST TECHNIQUE: Multidetector CT imaging of the head and cervical spine was performed following the standard protocol without intravenous contrast. Multiplanar CT image reconstructions of the cervical spine were also generated. COMPARISON:  None. FINDINGS: CT HEAD FINDINGS Diffuse cerebral atrophy. Ventricular dilatation consistent with central atrophy. Low-attenuation changes in the deep white matter consistent with small vessel ischemia. Prominent vascular calcifications. No mass effect or midline shift. No abnormal extra-axial fluid collections. Gray-white matter junctions are distinct. Basal cisterns are not effaced. No evidence of acute intracranial hemorrhage. No depressed skull fractures. Mucosal thickening in the right maxillary antrum. Mastoid air cells are not  opacified. CT CERVICAL SPINE FINDINGS There is reversal of the usual cervical lordosis without anterior subluxation. This may be due to patient positioning or degenerative change but ligamentous injury or muscle spasm can also have this appearance and are not excluded. No vertebral compression deformities. Degenerative changes throughout the cervical spine with narrowed interspaces and endplate hypertrophic changes. Degenerative changes in the facet joints. Uncovertebral and facet joint spurring causes encroachment upon the neural foramina at multiple levels bilaterally. No prevertebral soft tissue swelling. No vertebral compression deformities. C1-2 articulation appears intact. Sclerosis in the right pedicle of C2 is likely to represent a benign bone island. Soft tissues are unremarkable. Vascular calcifications. IMPRESSION: No acute intracranial abnormalities. Prominent chronic atrophy and small vessel ischemic changes. Nonspecific reversal of the usual cervical lordosis. Diffuse degenerative changes in the cervical spine. No acute displaced fractures identified. Electronically Signed   By: Lucienne Capers M.D.   On: 10/11/2015 06:34   Dg Hips Bilat With Pelvis 3-4 Views  10/11/2015  CLINICAL DATA:  Initial evaluation for acute seizure, fall. EXAM: DG HIP (WITH OR WITHOUT PELVIS) 3-4V BILAT COMPARISON:  None. FINDINGS: Bones are diffusely osteopenia, somewhat limiting evaluation. There is an acute subcapital fracture of through the left femoral neck with minimal displacement and slight impaction. Femoral head itself remains position within the acetabulum. Femoral head height preserved. Remainder of the visualized left femoral shaft intact. Bony acetabulum on the left is intact. Right hip intact. Right femoral head normally position within the acetabulum. Right femoral head and neck intact. Visualized proximal right femoral shaft intact. Visualized bony pelvis intact.  SI joints approximated. Mild degenerative  changes noted within the lower lumbar spine. No acute soft tissue abnormality.  Vascular calcifications noted. IMPRESSION: 1. Acute subcapital fracture of the proximal left femoral neck with slight displacement and impaction. 2. No acute fracture or dislocation about the right hip. 3. Diffuse osteopenia. Electronically Signed   By: Jeannine Boga M.D.   On: 10/11/2015 06:38    Other results: EKG: NSR, 77bpm, no contiguous Qs, no specific ST changes  Assessment & Plan by Problem: 81 year old male with hx of CVA and seizures here w/ left hip fracture.  Left displaced femoral neck fracture s/p fall from bed: Sounds like mechanical fall (just moved to SNF, new bed, felt he was dreaming and rolled out of bed).  No seizure activity noted and patient compliant with AED.  Needs OR.   Patient with limited activity at home (takes care of ADLS but little else, MET 1, but no hx of heart disease (confirmed with his daughter), DM or CKD and revised cardiac risk index for hip surgery would be low risk (0.9%).  Based on this  low risk can proceed to surgery if patient in agreement.  - admit to [place] has consulted ortho and recommendations and interventions are appreciated - NPO in case of OR today - PT post-op  Hx of seizure:  Currently on Topamax. - continue Topamax post op - seizure precautions  HCV possibly with cirrhosis:  Patient reports hx of HCV and says insurance would not cover Harvoni treatment.  He thinks he may have cirrhosis.   - check LFTs, PT/INR - outpatient follow-up with [facility]/hep clinic  Diet:  NPO in case of OR today VTE ppx:  Start [place] lovenox tonight if not going to the OR today, otherwise start 6 hours post-op Code status:  DNR confirmed with patient and patient's daughter Chauncey Reading along with patient's wife)  Dispo: Disposition is deferred at this time, awaiting improvement of current medical problems. Anticipated discharge in approximately 3-4 day(s).   The patient does  not have a current PCP (No primary care provider on file.) and may need an [facility] hospital follow-up appointment after discharge.  The patient does not have transportation limitations that hinder transportation to clinic appointments.  Signed: Francesca Oman, DO 10/11/2015, 8:21 AM

## 2015-10-11 NOTE — Brief Op Note (Signed)
10/11/2015  7:18 PM  PATIENT:  Randall Hoffman  81 y.o. male  PRE-OPERATIVE DIAGNOSIS:  Left Femoral Neck Fracture  POST-OPERATIVE DIAGNOSIS:  Left Femoral Neck Fracture  PROCEDURE:  Procedure(s): TOTAL HIP ARTHROPLASTY ANTERIOR APPROACH (Left)  SURGEON:  Surgeon(s) and Role:    * Kathryne Hitchhristopher Y Leilani Cespedes, MD - Primary  PHYSICIAN ASSISTANT: Rexene EdisonGil Clark, PA-C  ANESTHESIA:   general  EBL:   300 cc  COUNTS:  YES  TOURNIQUET:  * No tourniquets in log *  DICTATION: .Other Dictation: Dictation Number [id number]  PLAN OF CARE: Admit to inpatient   PATIENT DISPOSITION:  PACU - hemodynamically stable.   Delay start of Pharmacological VTE agent (>24hrs) due to surgical blood loss or risk of bleeding: not applicable

## 2015-10-11 NOTE — ED Notes (Signed)
DR. Magnus IvanBLACKMAN: please call!!! [phone] DONNA RAMONO is daughter.

## 2015-10-11 NOTE — ED Notes (Signed)
Patient is resting comfortably. 

## 2015-10-11 NOTE — ED Notes (Signed)
Pt arrives by [facility] with c/o BL leg and hip pain post fall. Pt rolled out of bed earlier in the evening, but refused EMS at that time. EMS called back out when pt c/o new pain. No deformities noted per EMS. Pt is from [facility]. Last vitals BP 98/70, P98, RR18, O2 99%RA

## 2015-10-11 NOTE — Anesthesia Procedure Notes (Signed)
Procedure Name: Intubation Date/Time: 10/11/2015 5:34 PM Performed by: Gavin PoundLOWDER, Sueanne Maniaci J Pre-anesthesia Checklist: Patient identified, Timeout performed, Emergency Drugs available, Suction available and Patient being monitored Patient Re-evaluated:Patient Re-evaluated prior to inductionOxygen Delivery Method: Circle system utilized Preoxygenation: Pre-oxygenation with 100% oxygen Intubation Type: IV induction Ventilation: Mask ventilation without difficulty Laryngoscope Size: Mac and 3 Grade View: Grade II Tube type: Oral Tube size: 7.0 mm Number of attempts: 1 Placement Confirmation: ETT inserted through vocal cords under direct vision,  breath sounds checked- equal and bilateral and positive ETCO2 Secured at: 22 cm Tube secured with: Tape Dental Injury: Teeth and Oropharynx as per pre-operative assessment

## 2015-10-11 NOTE — ED Notes (Signed)
Patient transported to imaging.

## 2015-10-11 NOTE — Anesthesia Postprocedure Evaluation (Signed)
Anesthesia Post Note  Patient: Randall Hoffman  Procedure(s) Performed: Procedure(s) (LRB): TOTAL HIP ARTHROPLASTY ANTERIOR APPROACH (Left)  Patient location during evaluation: PACU Anesthesia Type: General Level of consciousness: awake and alert and patient cooperative Pain management: pain level controlled Vital Signs Assessment: post-procedure vital signs reviewed and stable Respiratory status: spontaneous breathing and respiratory function stable Cardiovascular status: stable Anesthetic complications: no    Last Vitals:  Filed Vitals:   10/11/15 1956 10/11/15 2013  BP:  111/75  Pulse:  90  Temp: 36.4 C 36.6 C  Resp:  16    Last Pain:  Filed Vitals:   10/11/15 2014  PainSc: 4                  Sheneka Schrom S

## 2015-10-11 NOTE — ED Notes (Signed)
Teaching at bedside to admit.

## 2015-10-11 NOTE — Anesthesia Preprocedure Evaluation (Signed)
Anesthesia Evaluation  Patient identified by MRN, date of birth, ID band Patient awake    Reviewed: Allergy & Precautions, NPO status , Patient's Chart, lab work & pertinent test results  Airway Mallampati: II  TM Distance: >3 FB Neck ROM: Full    Dental no notable dental hx.    Pulmonary neg pulmonary ROS, former smoker,    Pulmonary exam normal breath sounds clear to auscultation       Cardiovascular negative cardio ROS Normal cardiovascular exam Rhythm:Regular Rate:Normal     Neuro/Psych CVA negative psych ROS   GI/Hepatic negative GI ROS, (+) Hepatitis -  Endo/Other  negative endocrine ROS  Renal/GU negative Renal ROS     Musculoskeletal negative musculoskeletal ROS (+)   Abdominal   Peds  Hematology negative hematology ROS (+)   Anesthesia Other Findings   Reproductive/Obstetrics                             Anesthesia Physical Anesthesia Plan  ASA: III  Anesthesia Plan:    Post-op Pain Management:    Induction:   Airway Management Planned:   Additional Equipment:   Intra-op Plan:   Post-operative Plan:   Informed Consent: I have reviewed the patients History and Physical, chart, labs and discussed the procedure including the risks, benefits and alternatives for the proposed anesthesia with the patient or authorized representative who has indicated his/her understanding and acceptance.   Dental advisory given  Plan Discussed with: CRNA  Anesthesia Plan Comments:         Anesthesia Quick Evaluation

## 2015-10-11 NOTE — ED Notes (Signed)
Pt lived in [place] for [place] years; been in hospital for 6 weeks there; had 2 strokes and seizure d/o for 25 years. Was weaned on phenobarbital which he had been on for 25 years. Was in a coma per daughter. Now on Topamax for seizures. Daughter lives here and just got him into [facility]. He is DNR and daughter is HPOA.

## 2015-10-11 NOTE — Progress Notes (Signed)
This nurse called and spoke with daughter regarding pt going back to OR around 1700 and that pt was reporting allergy to Penicillin but according to her, pt has no allergies Vira Agar(Donna Ramono [phone]). Daughter reports pt recently relocated [place] from [place] and [facility]. Daughter reports that she is with her mother and will come back to hospital tomorrow.

## 2015-10-11 NOTE — ED Notes (Signed)
BED CONTROL CALLED; ROOM HAS TO BE CLEANED.

## 2015-10-11 NOTE — Consult Note (Signed)
Reason for Consult:Left hip fracture Referring Physician: Lynnae January, Mikel Hardgrove is an 81 y.o. male.  HPI: 81 year old male with fall from bed last night now with acute left hip pain. Recently traveled from [place] to [place] to live closer to daughter. Now resident here in a SNF for 2 days. Family reports prior to move to [facility] had been in a hospital in [place] for several weeks status post a stroke. Reports he has been bed bound since the stroke.  Also has a history seizure disorder and hepatitis C.   Past Medical History  Diagnosis Date  . Stroke ([place])   . Seizure disorder ([place])   . Hepatitis C     History reviewed. No pertinent past surgical history.  Family History  Problem Relation Age of Onset  . Heart disease Other     unknown, patient says family members died long ago and he cannot remember    Social History:  reports that he has quit smoking. He does not have any smokeless tobacco history on file. He reports that he does not drink alcohol or use illicit drugs.  Allergies: No Known Allergies  Medications: I have reviewed the patient's current medications.  Results for orders placed or performed during the hospital encounter of 10/11/15 (from the past 48 hour(s))  Basic metabolic panel     Status: Abnormal   Collection Time: 10/11/15  5:41 AM  Result Value Ref Range   Sodium 135 135 - 145 mmol/L   Potassium 3.9 3.5 - 5.1 mmol/L   Chloride 102 101 - 111 mmol/L   CO2 24 22 - 32 mmol/L   Glucose, Bld 107 (H) 65 - 99 mg/dL   BUN 19 6 - 20 mg/dL   Creatinine, Ser 1.20 0.61 - 1.24 mg/dL   Calcium 9.2 8.9 - 10.3 mg/dL   GFR calc non Af Amer 56 (L) >60 mL/min   GFR calc Af Amer >60 >60 mL/min    Comment: (NOTE) The eGFR has been calculated using the CKD EPI equation. This calculation has not been validated in all clinical situations. eGFR's persistently <60 mL/min signify possible Chronic Kidney Disease.    Anion gap 9 5 - 15  CBC with Differential      Status: Abnormal   Collection Time: 10/11/15  5:41 AM  Result Value Ref Range   WBC 5.3 4.0 - 10.5 K/uL   RBC 3.99 (L) 4.22 - 5.81 MIL/uL   Hemoglobin 12.9 (L) 13.0 - 17.0 g/dL   HCT 37.6 (L) 39.0 - 52.0 %   MCV 94.2 78.0 - 100.0 fL   MCH 32.3 26.0 - 34.0 pg   MCHC 34.3 30.0 - 36.0 g/dL   RDW 13.7 11.5 - 15.5 %   Platelets 150 150 - 400 K/uL   Neutrophils Relative % 39 %   Neutro Abs 2.0 1.7 - 7.7 K/uL   Lymphocytes Relative 46 %   Lymphs Abs 2.5 0.7 - 4.0 K/uL   Monocytes Relative 10 %   Monocytes Absolute 0.6 0.1 - 1.0 K/uL   Eosinophils Relative 4 %   Eosinophils Absolute 0.2 0.0 - 0.7 K/uL   Basophils Relative 1 %   Basophils Absolute 0.0 0.0 - 0.1 K/uL  Type and screen [place]     Status: None   Collection Time: 10/11/15  9:19 AM  Result Value Ref Range   ABO/RH(D) B POS    Antibody Screen NEG    Sample Expiration 10/14/2015   ABO/Rh  Status: None   Collection Time: 10/11/15  9:19 AM  Result Value Ref Range   ABO/RH(D) B POS   Hepatic function panel     Status: Abnormal   Collection Time: 10/11/15  9:50 AM  Result Value Ref Range   Total Protein 6.7 6.5 - 8.1 g/dL   Albumin 3.1 (L) 3.5 - 5.0 g/dL   AST 53 (H) 15 - 41 U/L   ALT 42 17 - 63 U/L   Alkaline Phosphatase 76 38 - 126 U/L   Total Bilirubin 1.3 (H) 0.3 - 1.2 mg/dL   Bilirubin, Direct 0.4 0.1 - 0.5 mg/dL   Indirect Bilirubin 0.9 0.3 - 0.9 mg/dL  Protime-INR     Status: Abnormal   Collection Time: 10/11/15  9:50 AM  Result Value Ref Range   Prothrombin Time 16.7 (H) 11.6 - 15.2 seconds   INR 1.34 0.00 - 1.49    Ct Head Wo Contrast  10/11/2015  CLINICAL DATA:  Patient fell out of bed. Now with head and neck pain. EXAM: CT HEAD WITHOUT CONTRAST CT CERVICAL SPINE WITHOUT CONTRAST TECHNIQUE: Multidetector CT imaging of the head and cervical spine was performed following the standard protocol without intravenous contrast. Multiplanar CT image reconstructions of the cervical spine were  also generated. COMPARISON:  None. FINDINGS: CT HEAD FINDINGS Diffuse cerebral atrophy. Ventricular dilatation consistent with central atrophy. Low-attenuation changes in the deep white matter consistent with small vessel ischemia. Prominent vascular calcifications. No mass effect or midline shift. No abnormal extra-axial fluid collections. Gray-white matter junctions are distinct. Basal cisterns are not effaced. No evidence of acute intracranial hemorrhage. No depressed skull fractures. Mucosal thickening in the right maxillary antrum. Mastoid air cells are not opacified. CT CERVICAL SPINE FINDINGS There is reversal of the usual cervical lordosis without anterior subluxation. This may be due to patient positioning or degenerative change but ligamentous injury or muscle spasm can also have this appearance and are not excluded. No vertebral compression deformities. Degenerative changes throughout the cervical spine with narrowed interspaces and endplate hypertrophic changes. Degenerative changes in the facet joints. Uncovertebral and facet joint spurring causes encroachment upon the neural foramina at multiple levels bilaterally. No prevertebral soft tissue swelling. No vertebral compression deformities. C1-2 articulation appears intact. Sclerosis in the right pedicle of C2 is likely to represent a benign bone island. Soft tissues are unremarkable. Vascular calcifications. IMPRESSION: No acute intracranial abnormalities. Prominent chronic atrophy and small vessel ischemic changes. Nonspecific reversal of the usual cervical lordosis. Diffuse degenerative changes in the cervical spine. No acute displaced fractures identified. Electronically Signed   By: Lucienne Capers M.D.   On: 10/11/2015 06:34   Ct Cervical Spine Wo Contrast  10/11/2015  CLINICAL DATA:  Patient fell out of bed. Now with head and neck pain. EXAM: CT HEAD WITHOUT CONTRAST CT CERVICAL SPINE WITHOUT CONTRAST TECHNIQUE: Multidetector CT imaging of  the head and cervical spine was performed following the standard protocol without intravenous contrast. Multiplanar CT image reconstructions of the cervical spine were also generated. COMPARISON:  None. FINDINGS: CT HEAD FINDINGS Diffuse cerebral atrophy. Ventricular dilatation consistent with central atrophy. Low-attenuation changes in the deep white matter consistent with small vessel ischemia. Prominent vascular calcifications. No mass effect or midline shift. No abnormal extra-axial fluid collections. Gray-white matter junctions are distinct. Basal cisterns are not effaced. No evidence of acute intracranial hemorrhage. No depressed skull fractures. Mucosal thickening in the right maxillary antrum. Mastoid air cells are not opacified. CT CERVICAL SPINE FINDINGS There is reversal of  the usual cervical lordosis without anterior subluxation. This may be due to patient positioning or degenerative change but ligamentous injury or muscle spasm can also have this appearance and are not excluded. No vertebral compression deformities. Degenerative changes throughout the cervical spine with narrowed interspaces and endplate hypertrophic changes. Degenerative changes in the facet joints. Uncovertebral and facet joint spurring causes encroachment upon the neural foramina at multiple levels bilaterally. No prevertebral soft tissue swelling. No vertebral compression deformities. C1-2 articulation appears intact. Sclerosis in the right pedicle of C2 is likely to represent a benign bone island. Soft tissues are unremarkable. Vascular calcifications. IMPRESSION: No acute intracranial abnormalities. Prominent chronic atrophy and small vessel ischemic changes. Nonspecific reversal of the usual cervical lordosis. Diffuse degenerative changes in the cervical spine. No acute displaced fractures identified. Electronically Signed   By: Lucienne Capers M.D.   On: 10/11/2015 06:34   Dg Chest Port 1 View  10/11/2015  CLINICAL DATA:   Preoperative evaluation.  History of tobacco use EXAM: PORTABLE CHEST 1 VIEW COMPARISON:  None. FINDINGS: Calcified pleural plaques are noted on the left. There is no edema or consolidation. Heart size and pulmonary vascularity are normal. No adenopathy. There is atherosclerotic calcification in the aorta. No bone lesions are evident. IMPRESSION: Calcified pleural plaques on the left. Question history of asbestos exposure. No edema or consolidation. Electronically Signed   By: Lowella Grip III M.D.   On: 10/11/2015 08:49   Dg Hips Bilat With Pelvis 3-4 Views  10/11/2015  CLINICAL DATA:  Initial evaluation for acute seizure, fall. EXAM: DG HIP (WITH OR WITHOUT PELVIS) 3-4V BILAT COMPARISON:  None. FINDINGS: Bones are diffusely osteopenia, somewhat limiting evaluation. There is an acute subcapital fracture of through the left femoral neck with minimal displacement and slight impaction. Femoral head itself remains position within the acetabulum. Femoral head height preserved. Remainder of the visualized left femoral shaft intact. Bony acetabulum on the left is intact. Right hip intact. Right femoral head normally position within the acetabulum. Right femoral head and neck intact. Visualized proximal right femoral shaft intact. Visualized bony pelvis intact.  SI joints approximated. Mild degenerative changes noted within the lower lumbar spine. No acute soft tissue abnormality.  Vascular calcifications noted. IMPRESSION: 1. Acute subcapital fracture of the proximal left femoral neck with slight displacement and impaction. 2. No acute fracture or dislocation about the right hip. 3. Diffuse osteopenia. Electronically Signed   By: Jeannine Boga M.D.   On: 10/11/2015 06:38    Review of Systems  Constitutional: Negative for fever and chills.  HENT: Negative.   Respiratory: Negative.   Cardiovascular: Negative.   Musculoskeletal: Positive for joint pain and falls.  Neurological: Positive for seizures  and weakness. Negative for loss of consciousness.   Blood pressure 116/85, pulse 63, temperature 97.6 F (36.4 C), temperature source Oral, resp. rate 16, SpO2 100 %. Physical Exam  Constitutional: He is oriented to person, place, and time. He appears well-developed and well-nourished.  HENT:  Head: Normocephalic and atraumatic.  Eyes: EOM are normal.  Cardiovascular: Normal rate and intact distal pulses.   Respiratory: Effort normal.  Musculoskeletal:  No leg length discrepancy . Gentle range of motion left leg causes discomfort. No gross deformity of lower extremities. Able to perform left lower leg raise. Sensation grossly intact bilateral feet to light touch.   Neurological: He is alert and oriented to person, place, and time.  Skin: Skin is warm and dry.  Psychiatric: He has a normal mood and affect.  Assessment/Plan: Left hip subcapital hip fracture with mild displacement. Plan patient will need surgical intervention for left hip fracture to aid in mobilization and alleviate pain. Dr. Ninfa Linden discussed with family and patient surgical procedures including cannulated pinning and left hip . We will precede with a left total hip arthroplasty later this evening. Recent Stroke currently no reported anticoag NPO Strict bed rest   Chidera Thivierge 10/11/2015, 12:12 PM

## 2015-10-11 NOTE — H&P (Signed)
Date: 10/11/2015               Patient Name:  Randall Hoffman MRN: [id number]  DOB: May 03, 1935 Age / Sex: 81 y.o., male   PCP: No primary care provider on file.              Medical Service: Internal Medicine Teaching Service              Attending Physician: Dr. Burns Spain, MD    First Contact: Patrica Duel, MS4 Pager: [phone]  Second Contact: Dr. Isabella Bowens Pager: [phone]       After Hours (After 5p/  First Contact Pager: [phone]  weekends / holidays): Second Contact Pager: [phone]   Chief Complaint: Hip pain  History of Present Illness: Randall Hoffman is a 81 year old man with past medical history significant for recent stroke, well-controlled seizure disorder, and untreated Hepatitis C infection presenting with left hip pain after sustaining an unwitnessed fall from his bed last night. He began residing at [facility] senior living facility two days ago in order to be closer to his daughter, after previously being in the hospital for ~6 weeks in [place] for a stroke and leg weakness. He reports having a bad dream last night and believes he rolled off the bed, hitting his head on the way down and landing on his left hip. He notes associated bowel incontinence, but does not recall anything else about the incident. Staff found him lying on his left side beside the bed an estimated 5-10 minutes after the episode. Randall Hoffman has some mild episodes of confusion at baseline, and neither he nor the care staff can report whether there were any other associated symptoms. EMS was called, who helped him back into bed; at that time the patient did not believe he needed to go the hospital. He awoke several hours later in the middle of the night with severe hip pain and requested to be brought to the ED. On presentation, he complained of head and neck pain and some dizziness. He denies new weakness or confusion.   According to the patient and his daughter, his recent hospitalization for stroke  management in [place] was complicated by "a coma" and during that stay his anti-seizure regimen was switched from phenobarbitol to topamax for formulary reasons. He has not had a seizure in many years. They confirm his wishes to be DNR, and his daughter and wife serve as healthcare power of attorney.    Past Medical History:  Past Medical History  Diagnosis Date  . Stroke (HCC)   . Seizure disorder (HCC)   . Hepatitis C   . History reviewed. No pertinent past surgical history.  Meds: Current Facility-Administered Medications  Medication Dose Route Frequency Provider Last Rate Last Dose  . topiramate (TOPAMAX) tablet 50 mg  50 mg Oral BID Yolanda Manges, DO       Current Outpatient Prescriptions  Medication Sig Dispense Refill  . acetaminophen (TYLENOL) 500 MG tablet Take 1,000 mg by mouth 2 (two) times daily.    Marland Kitchen aspirin 81 MG chewable tablet Chew 81 mg by mouth daily.    Marland Kitchen atorvastatin (LIPITOR) 10 MG tablet Take 10 mg by mouth daily.    . citalopram (CELEXA) 10 MG tablet Take 10 mg by mouth daily.    Marland Kitchen ENSURE (ENSURE) Take 237 mLs by mouth daily.    . tamsulosin (FLOMAX) 0.4 MG CAPS capsule Take 0.4 mg by mouth at bedtime.    . topiramate (TOPAMAX)  50 MG tablet Take 50 mg by mouth 2 (two) times daily.      Allergies: Allergies as of 10/11/2015  . (No Known Allergies)    Family History:  Family History  Problem Relation Age of Onset  . Heart disease Other     unknown, patient says family members died long ago and he cannot remember    Social History: Social History   Social History  . Marital Status: Married    Spouse Name: N/A  . Number of Children: N/A  . Years of Education: N/A   Occupational History  . Not on file.   Social History Main Topics  . Smoking status: Former Games developer  . Smokeless tobacco: Not on file  . Alcohol Use: No  . Drug Use: No  . Sexual Activity: No   Other Topics Concern  . Not on file   Social History Narrative    Review of  Systems:  General: No history of fevers, night sweats, weight loss Cardio: No history of chest pain, palpitations Pulmonary: No shortness of breath, wheezing, or dyspnea on exertion GI: Positive for fecal incontinence. No abdominal pain, no swelling. No nausea or vomiting. Renal: +/- mild urinary incontinence. No changes in urination, including dysuria, hematuria, or changes in urine output Extremities: + for hip pain as per HPI. No history of peripheral edema or swelling.   Objective: Blood pressure 91/66, pulse 65, temperature 97.6 F (36.4 C), temperature source Oral, resp. rate 21, SpO2 100 %.  Physical Exam:  General: Lying in bed comfortably in NAD HEENT: Moist mucous membranes, oropharynx clear. Sclera anicteric, extraocular movements intact Neck: Nontender without masses or adenopathy. No JVD or carotid bruit.  Cardiology: Regular rate and rhythm without murmurs, rubs, or gallops.  Pulmonary: Stable on room air. Lungs clear to auscultation bilaterally without crackles or wheezes Abdomen: Normoactive bowel sounds. Soft, non-tender to palpation, non-distended. No organomegaly or masses.  Extremities: Pain with active and passive movement of left lower extremity, but able to perform leg raise. Limited range of motion 2/2 to pain and weakness. Sensation intact and symmetric with contralateral extremity with no focal deficits.  Neuro: Alert & oriented. Appropriately conversive.  Skin: warm and well-perfused  Lab results: Results for orders placed or performed during the hospital encounter of 10/11/15 (from the past 24 hour(s))  Basic metabolic panel     Status: Abnormal   Collection Time: 10/11/15  5:41 AM  Result Value Ref Range   Sodium 135 135 - 145 mmol/L   Potassium 3.9 3.5 - 5.1 mmol/L   Chloride 102 101 - 111 mmol/L   CO2 24 22 - 32 mmol/L   Glucose, Bld 107 (H) 65 - 99 mg/dL   BUN 19 6 - 20 mg/dL   Creatinine, Ser 1.61 0.61 - 1.24 mg/dL   Calcium 9.2 8.9 - 09.6 mg/dL    GFR calc non Af Amer 56 (L) >60 mL/min   GFR calc Af Amer >60 >60 mL/min   Anion gap 9 5 - 15  CBC with Differential     Status: Abnormal   Collection Time: 10/11/15  5:41 AM  Result Value Ref Range   WBC 5.3 4.0 - 10.5 K/uL   RBC 3.99 (L) 4.22 - 5.81 MIL/uL   Hemoglobin 12.9 (L) 13.0 - 17.0 g/dL   HCT 04.5 (L) 40.9 - 81.1 %   MCV 94.2 78.0 - 100.0 fL   MCH 32.3 26.0 - 34.0 pg   MCHC 34.3 30.0 - 36.0 g/dL  RDW 13.7 11.5 - 15.5 %   Platelets 150 150 - 400 K/uL   Neutrophils Relative % 39 %   Neutro Abs 2.0 1.7 - 7.7 K/uL   Lymphocytes Relative 46 %   Lymphs Abs 2.5 0.7 - 4.0 K/uL   Monocytes Relative 10 %   Monocytes Absolute 0.6 0.1 - 1.0 K/uL   Eosinophils Relative 4 %   Eosinophils Absolute 0.2 0.0 - 0.7 K/uL   Basophils Relative 1 %   Basophils Absolute 0.0 0.0 - 0.1 K/uL  Type and screen [facility]     Status: None   Collection Time: 10/11/15  9:19 AM  Result Value Ref Range   ABO/RH(D) B POS    Antibody Screen NEG    Sample Expiration 10/14/2015   Hepatic function panel     Status: Abnormal   Collection Time: 10/11/15  9:50 AM  Result Value Ref Range   Total Protein 6.7 6.5 - 8.1 g/dL   Albumin 3.1 (L) 3.5 - 5.0 g/dL   AST 53 (H) 15 - 41 U/L   ALT 42 17 - 63 U/L   Alkaline Phosphatase 76 38 - 126 U/L   Total Bilirubin 1.3 (H) 0.3 - 1.2 mg/dL   Bilirubin, Direct 0.4 0.1 - 0.5 mg/dL   Indirect Bilirubin 0.9 0.3 - 0.9 mg/dL  Protime-INR     Status: Abnormal   Collection Time: 10/11/15  9:50 AM  Result Value Ref Range   Prothrombin Time 16.7 (H) 11.6 - 15.2 seconds   INR 1.34 0.00 - 1.49   Pending: U/a, UDS  Imaging results:  Ct Head Wo Contrast  10/11/2015  CLINICAL DATA:  Patient fell out of bed. Now with head and neck pain. EXAM: CT HEAD WITHOUT CONTRAST CT CERVICAL SPINE WITHOUT CONTRAST TECHNIQUE: Multidetector CT imaging of the head and cervical spine was performed following the standard protocol without intravenous contrast. Multiplanar  CT image reconstructions of the cervical spine were also generated. COMPARISON:  None. FINDINGS: CT HEAD FINDINGS Diffuse cerebral atrophy. Ventricular dilatation consistent with central atrophy. Low-attenuation changes in the deep white matter consistent with small vessel ischemia. Prominent vascular calcifications. No mass effect or midline shift. No abnormal extra-axial fluid collections. Gray-white matter junctions are distinct. Basal cisterns are not effaced. No evidence of acute intracranial hemorrhage. No depressed skull fractures. Mucosal thickening in the right maxillary antrum. Mastoid air cells are not opacified. CT CERVICAL SPINE FINDINGS There is reversal of the usual cervical lordosis without anterior subluxation. This may be due to patient positioning or degenerative change but ligamentous injury or muscle spasm can also have this appearance and are not excluded. No vertebral compression deformities. Degenerative changes throughout the cervical spine with narrowed interspaces and endplate hypertrophic changes. Degenerative changes in the facet joints. Uncovertebral and facet joint spurring causes encroachment upon the neural foramina at multiple levels bilaterally. No prevertebral soft tissue swelling. No vertebral compression deformities. C1-2 articulation appears intact. Sclerosis in the right pedicle of C2 is likely to represent a benign bone island. Soft tissues are unremarkable. Vascular calcifications. IMPRESSION: No acute intracranial abnormalities. Prominent chronic atrophy and small vessel ischemic changes. Nonspecific reversal of the usual cervical lordosis. Diffuse degenerative changes in the cervical spine. No acute displaced fractures identified. Electronically Signed   By: Burman Nieves M.D.   On: 10/11/2015 06:34   Ct Cervical Spine Wo Contrast  10/11/2015  CLINICAL DATA:  Patient fell out of bed. Now with head and neck pain. EXAM: CT HEAD WITHOUT  CONTRAST CT CERVICAL SPINE  WITHOUT CONTRAST TECHNIQUE: Multidetector CT imaging of the head and cervical spine was performed following the standard protocol without intravenous contrast. Multiplanar CT image reconstructions of the cervical spine were also generated. COMPARISON:  None. FINDINGS: CT HEAD FINDINGS Diffuse cerebral atrophy. Ventricular dilatation consistent with central atrophy. Low-attenuation changes in the deep white matter consistent with small vessel ischemia. Prominent vascular calcifications. No mass effect or midline shift. No abnormal extra-axial fluid collections. Gray-white matter junctions are distinct. Basal cisterns are not effaced. No evidence of acute intracranial hemorrhage. No depressed skull fractures. Mucosal thickening in the right maxillary antrum. Mastoid air cells are not opacified. CT CERVICAL SPINE FINDINGS There is reversal of the usual cervical lordosis without anterior subluxation. This may be due to patient positioning or degenerative change but ligamentous injury or muscle spasm can also have this appearance and are not excluded. No vertebral compression deformities. Degenerative changes throughout the cervical spine with narrowed interspaces and endplate hypertrophic changes. Degenerative changes in the facet joints. Uncovertebral and facet joint spurring causes encroachment upon the neural foramina at multiple levels bilaterally. No prevertebral soft tissue swelling. No vertebral compression deformities. C1-2 articulation appears intact. Sclerosis in the right pedicle of C2 is likely to represent a benign bone island. Soft tissues are unremarkable. Vascular calcifications. IMPRESSION: No acute intracranial abnormalities. Prominent chronic atrophy and small vessel ischemic changes. Nonspecific reversal of the usual cervical lordosis. Diffuse degenerative changes in the cervical spine. No acute displaced fractures identified. Electronically Signed   By: Burman Nieves M.D.   On: 10/11/2015 06:34     Dg Chest Port 1 View  10/11/2015  CLINICAL DATA:  Preoperative evaluation.  History of tobacco use EXAM: PORTABLE CHEST 1 VIEW COMPARISON:  None. FINDINGS: Calcified pleural plaques are noted on the left. There is no edema or consolidation. Heart size and pulmonary vascularity are normal. No adenopathy. There is atherosclerotic calcification in the aorta. No bone lesions are evident. IMPRESSION: Calcified pleural plaques on the left. Question history of asbestos exposure. No edema or consolidation. Electronically Signed   By: Bretta Bang III M.D.   On: 10/11/2015 08:49   Dg Hips Bilat With Pelvis 3-4 Views  10/11/2015  CLINICAL DATA:  Initial evaluation for acute seizure, fall. EXAM: DG HIP (WITH OR WITHOUT PELVIS) 3-4V BILAT COMPARISON:  None. FINDINGS: Bones are diffusely osteopenia, somewhat limiting evaluation. There is an acute subcapital fracture of through the left femoral neck with minimal displacement and slight impaction. Femoral head itself remains position within the acetabulum. Femoral head height preserved. Remainder of the visualized left femoral shaft intact. Bony acetabulum on the left is intact. Right hip intact. Right femoral head normally position within the acetabulum. Right femoral head and neck intact. Visualized proximal right femoral shaft intact. Visualized bony pelvis intact.  SI joints approximated. Mild degenerative changes noted within the lower lumbar spine. No acute soft tissue abnormality.  Vascular calcifications noted. IMPRESSION: 1. Acute subcapital fracture of the proximal left femoral neck with slight displacement and impaction. 2. No acute fracture or dislocation about the right hip. 3. Diffuse osteopenia. Electronically Signed   By: Rise Mu M.D.   On: 10/11/2015 06:38    Other results: EKG: Sinus rhythm. Preliminary result - normal EKG without evidence of acute or prior ischemia on my read. .  Assessment & Plan by Problem: Mr. Kazmi is a 81  yo gentleman with PMH significant for strokes, seizure disorder, and HepC presenting with left hip pain after an unwitnessed  fall and found to have acute subcapital fracture of the left femoral neck.   Left subcapital femoral fracture: X-ray findings show slight displacement and impaction. Orthopedic surgery was consulted by the ED physician. From a perioperative risk perspective, he has no history of cardiac disease. Revised cardiac risk index estimates a moderately low perioperative risk of 0.9%. He has a distant smoking history, but no current respiratory symptoms; current estimated risk probability for Postoperative Respiratory Failure (CHEST score): 0.56%. Untreated Hepatitis C concerning for possibility of cirrhosis, however he is Childs-Pugh class A with low perioperative risk. No evidence of acute hepatitis, ascites, coagulopathy, or encephalopathy. Given his age and history of stroke, there is appropriate concern for going to the operating room, however he is medically low risk according to verified models we have available.  -Consult placed to ortho, we appreciate reccommendations -->Surgery scheduled for this evening  History of stroke: Unclear history. Risk factors include former smoker and age. He was hospitalized for several weeks with a few "losses of consciousness." He is on Aspirin 81mg  daily for secondary prophylaxis.  -Hold home Aspirin 81 mg for surgery  Seizure disorder: Reportedly well-controlled without having any seizure activity in many years. Recently switched from Phenobarbitol to topamax for anti-epileptic control, without issue. Facility staff and EMS do not report any clear post-ictal symptoms after his fall last night, but the patient is unable to recall everything that occurred other than noting fecal incontinence. He does have episodes of both incontinence and confusion at baseline.  -Continue home topamax 50 BID  Hepatitis C: Untreated 2/2 financial constraints. Hepatic  function panel shows mildly elevated AST of 53 and ALT of 42. Total bilirubin 1.3 (direct 0.4), INR 1.34.   -Consider Hep C RNA  Diet: NPO pending surgery Prophylaxis: None pending surgery. Does have risk factors of previous stroke, immobilization during hospitalization, recent [place] from [place], and long-bone fracture  Dispo: Disposition is deferred at this time, awaiting improvement of current medical problems. Anticipated discharge in approximately 2-3 day(s).   The patient does not have a current PCP here in [place] (No primary care provider on file.) and [facility] after discharge.  The patient does not have transportation limitations that hinder transportation to clinic appointments.  This is a Psychologist, occupationalMedical Student Note.  The care of the patient was discussed with Dr. Andrey CampanileWilson and the assessment and plan was formulated with their assistance.  Please see their note for official documentation of the patient encounter.   Signed: Jonell CluckJoshua S Kelcey Wickstrom, Med Student 10/11/2015, 11:06 AM

## 2015-10-11 NOTE — Transfer of Care (Signed)
Immediate Anesthesia Transfer of Care Note  Patient: Randall Hoffman  Procedure(s) Performed: Procedure(s): TOTAL HIP ARTHROPLASTY ANTERIOR APPROACH (Left)  Patient Location: PACU  Anesthesia Type:General  Level of Consciousness: awake  Airway & Oxygen Therapy: Patient Spontanous Breathing and Patient connected to nasal cannula oxygen  Post-op Assessment: Report given to RN and Post -op Vital signs reviewed and stable  Post vital signs: Reviewed and stable  Last Vitals:  Filed Vitals:   10/11/15 1530 10/11/15 1920  BP: 117/66 137/93  Pulse: 66 122  Temp:  36.9 C  Resp: 13 22    Last Pain:  Filed Vitals:   10/11/15 1925  PainSc: Asleep         Complications: No apparent anesthesia complications

## 2015-10-12 ENCOUNTER — Encounter (HOSPITAL_COMMUNITY): Payer: Self-pay | Admitting: Orthopaedic Surgery

## 2015-10-12 DIAGNOSIS — S72002D Fracture of unspecified part of neck of left femur, subsequent encounter for closed fracture with routine healing: Secondary | ICD-10-CM

## 2015-10-12 DIAGNOSIS — Z8673 Personal history of transient ischemic attack (TIA), and cerebral infarction without residual deficits: Secondary | ICD-10-CM

## 2015-10-12 DIAGNOSIS — I700 Atherosclerosis of aorta: Secondary | ICD-10-CM | POA: Diagnosis present

## 2015-10-12 DIAGNOSIS — W06XXXD Fall from bed, subsequent encounter: Secondary | ICD-10-CM

## 2015-10-12 DIAGNOSIS — B182 Chronic viral hepatitis C: Secondary | ICD-10-CM

## 2015-10-12 DIAGNOSIS — G40909 Epilepsy, unspecified, not intractable, without status epilepticus: Secondary | ICD-10-CM

## 2015-10-12 DIAGNOSIS — Z96642 Presence of left artificial hip joint: Secondary | ICD-10-CM

## 2015-10-12 DIAGNOSIS — Z7982 Long term (current) use of aspirin: Secondary | ICD-10-CM

## 2015-10-12 LAB — RAPID URINE DRUG SCREEN, HOSP PERFORMED
AMPHETAMINES: NOT DETECTED
BARBITURATES: POSITIVE — AB
Benzodiazepines: NOT DETECTED
Cocaine: NOT DETECTED
Opiates: POSITIVE — AB
TETRAHYDROCANNABINOL: NOT DETECTED

## 2015-10-12 LAB — URINALYSIS, ROUTINE W REFLEX MICROSCOPIC
Bilirubin Urine: NEGATIVE
GLUCOSE, UA: NEGATIVE mg/dL
Hgb urine dipstick: NEGATIVE
Ketones, ur: 15 mg/dL — AB
Nitrite: NEGATIVE
PH: 6 (ref 5.0–8.0)
Protein, ur: NEGATIVE mg/dL
Specific Gravity, Urine: 1.022 (ref 1.005–1.030)

## 2015-10-12 LAB — BASIC METABOLIC PANEL
ANION GAP: 11 (ref 5–15)
BUN: 16 mg/dL (ref 6–20)
CHLORIDE: 102 mmol/L (ref 101–111)
CO2: 23 mmol/L (ref 22–32)
CREATININE: 1.18 mg/dL (ref 0.61–1.24)
Calcium: 8.5 mg/dL — ABNORMAL LOW (ref 8.9–10.3)
GFR calc non Af Amer: 57 mL/min — ABNORMAL LOW (ref >60)
Glucose, Bld: 101 mg/dL — ABNORMAL HIGH (ref 65–99)
POTASSIUM: 4.2 mmol/L (ref 3.5–5.1)
SODIUM: 136 mmol/L (ref 135–145)

## 2015-10-12 LAB — CBC
HCT: 31.5 % — ABNORMAL LOW (ref 39.0–52.0)
HEMOGLOBIN: 10.4 g/dL — AB (ref 13.0–17.0)
MCH: 31.2 pg (ref 26.0–34.0)
MCHC: 33 g/dL (ref 30.0–36.0)
MCV: 94.6 fL (ref 78.0–100.0)
Platelets: 180 10*3/uL (ref 150–400)
RBC: 3.33 MIL/uL — AB (ref 4.22–5.81)
RDW: 13.7 % (ref 11.5–15.5)
WBC: 6 10*3/uL (ref 4.0–10.5)

## 2015-10-12 LAB — URINE MICROSCOPIC-ADD ON

## 2015-10-12 MED ORDER — ENOXAPARIN SODIUM 40 MG/0.4ML ~~LOC~~ SOLN
40.0000 mg | Freq: Every day | SUBCUTANEOUS | Status: DC
  Administered 2015-10-12 – 2015-10-13 (×2): 40 mg via SUBCUTANEOUS
  Filled 2015-10-12 (×2): qty 0.4

## 2015-10-12 MED ORDER — KETOROLAC TROMETHAMINE 15 MG/ML IJ SOLN
15.0000 mg | Freq: Four times a day (QID) | INTRAMUSCULAR | Status: DC
  Administered 2015-10-12 – 2015-10-16 (×17): 15 mg via INTRAVENOUS
  Filled 2015-10-12 (×17): qty 1

## 2015-10-12 MED ORDER — ASPIRIN 81 MG PO CHEW
81.0000 mg | CHEWABLE_TABLET | Freq: Every day | ORAL | Status: DC
  Administered 2015-10-13 – 2015-10-15 (×3): 81 mg via ORAL
  Filled 2015-10-12 (×4): qty 1

## 2015-10-12 NOTE — Progress Notes (Signed)
PT Cancellation Note  Patient Details Name: Randall Hoffman MRN: [id number] DOB: 10/30/1934   Cancelled Treatment:    Reason Eval/Treat Not Completed: Patient refusing PT at this time, stating that he is in too much pain. Nursing aware. Will check back for evaluation as able.    Christiane HaBenjamin J. Bannon Giammarco, PT, CSCS Pager [phone] Office [phone]  10/12/2015, 10:51 AM

## 2015-10-12 NOTE — Progress Notes (Signed)
Pt unable to void since admission to unit to from PACU. Bladder scan at 0100 showed 545cc urine in bladder. Pt complaining of discomfort and tightness in lower abdomen. MD on call paged and made aware. Verbal order received to in and out cath. In and out cath performed; 650cc urine returned. Pt alerted staff to continued discomfort and tightness in lower abdomen at 0545. Bladder scan only revealed 245cc urine. Pt urged this RN to repeat in and out cath, stating that "my bladder pain is worse than my hip pain right now." At pt's request, in and out cath performed. 100cc urine returned. Pt stated discomfort was somewhat better. IV fluids running, PO fluids encouraged. Nursing will pass on to upcoming shift and continue to monitor.

## 2015-10-12 NOTE — Progress Notes (Signed)
Internal medicine Resident made aware of patient inability to void. Patient uncomplaining at this time.willcontinue to monitor

## 2015-10-12 NOTE — Op Note (Signed)
NAMJudyann Munson:  Leclere, Gregori             ACCOUNT NO.:  [id number]  MEDICAL RECORD NO.:  [id number]  LOCATION:  [id number]                        FACILITY:  [facility]  PHYSICIAN:  Vanita PandaChristopher Y. Magnus IvanBlackman, M.D.DATE OF BIRTH:  10-Jun-1934  DATE OF PROCEDURE:  10/11/2015 DATE OF DISCHARGE:                              OPERATIVE REPORT   POSTOPERATIVE DIAGNOSIS:  Displaced left hip femoral neck fracture.  POSTOPERATIVE DIAGNOSIS:  Displaced left hip femoral neck fracture.  PROCEDURE:  Left total hip arthroplasty through direct anterior approach.  IMPLANTS:  DePuy Sector Gription acetabular component size 52, size 36 + 0 neutral polyethylene liner, size 12 Corail femoral component with standard offset, size 36 + 1.5 metal hip ball.  SURGEON:  Vanita PandaChristopher Y. Magnus IvanBlackman, M.D.  ANESTHESIA:  General.  ANTIBIOTICS:  2 g IV Ancef.  BLOOD LOSS:  250 to 300 mL.  COMPLICATIONS:  None.  INDICATIONS:  Mr. Gus Rankinngarola is a 81 year old gentleman who has had a previous history of a stroke recently.  He was brought to [place] from [place] and was admitted to nursing home yesterday by his family. He somehow rolled out of his nursing home bed late last night or early this morning and was transported to the [facility].  X-rays of hip showed a femoral neck fracture with some slight displacement.  He is someone who has profound weakness, and we felt that a total hip arthroplasty is warranted in this situation to try to be able to get him in sitting up and mobilizing better so we can allow full weightbearing. I discussed this with his daughter in detail who is also a patient of mine.  I explained in detail the risks and benefits of surgery.  After a thorough discussion, she agreed with this as well.  He was admitted to the Medicine Service and cleared for surgery.  PROCEDURE DESCRIPTION:  After informed consent was obtained, appropriate left hip was marked.  He was brought to the operating room.   General anesthesia was obtained while he was on the stretcher.  Traction boots were placed on both his feet.  Next, he was placed supine on the Hana fracture table with the perineal post in place and both legs in inline skeletal traction device but no traction applied.  His left operative hip was prepped and draped with DuraPrep and sterile drapes.  Time-out was called.  He was identified as correct patient and correct left hip. I then made an incision inferior and posterior to the anterior superior iliac spine and carried this obliquely down the leg.  I dissected down the tensor fascia lata muscle.  The tensor fascia was then divided longitudinally, so I could see the direct anterior approach to the hip. We identified and cauterized the lateral femoral circumflex vessels and identified the hip capsule opening up the hip capsule finding just a mild hematoma.  We could see that there was a femoral neck fracture.  We placed the Cobra retractors within the hip capsule and then made our femoral neck cut proximal to the lesser trochanter and completed this with on osteotome.  We placed a corkscrew guide in the femoral head and removed femoral head its entirety.  We then cleaned the acetabulum  of remnants of the acetabular labrum and other debris.  We placed a bent Hohmann over the medial acetabular rim.  We then began reaming under direct visualization from a size 42 reamer up to a size 52 with all reamers under direct visualization.  The last 2 reamer under direct fluoroscopy, so we could obtain our depth of reaming our inclination and anteversion.  Once we were pleased with this, we placed the real DePuy Sector Gription acetabular component size 52 and a 36 + 0 neutral liner for that size acetabular component.  Attention was then turned to the femur.  With the leg externally rotated to 100 degrees, extended and adducted. We placed a Mueller retractor medially and a Hohmann retractor behind  the greater trochanter.  I released the lateral joint capsule and used a box cutting osteotome in the inner femoral canal and a rongeur to lateralize.  We then began broaching from a size 8 to broach up to a size 12.  With a size 12 in place, we trialed a standard offset femoral neck and a 36 + 1.5 hip ball reduced this acetabulum.  We were pleased with the range of motion, offset, and stability as well as leg lengths. We then dislocated the hip under the trial components.  We were able to place the real Corail femoral component with standard offset, size 12 and the real 36 + 1.5 metal hip ball.  We then reduced this in acetabulum, it was stable.  We then copiously irrigated the soft tissues with normal saline solution using pulsatile lavage.  We closed the joint capsule with interrupted #1 Ethibond suture followed by running #1 Vicryl in the tensor fascia, 0 Vicryl in the deep tissue, 2-0 Vicryl in subcutaneous tissue, interrupted staples on the skin.  An Aquacel dressing was applied.  He was then taken off the Hana table, awakened, extubated and taken to the recovery room in stable condition.  All final counts were correct.  There were no complications noted.  Of note, Richardean Canal PA-C assisted in the entire case.  His assistance was crucial for facilitating all aspects of this case.     Vanita Panda. Magnus Ivan, M.D.     CYB/MEDQ  D:  10/11/2015  T:  10/12/2015  Job:  [id number]

## 2015-10-12 NOTE — Progress Notes (Addendum)
  Date: 10/12/2015  Patient name: Randall IrishDonald Hufstedler  Medical record number: [id number]  Date of birth: 11/26/1934   I have seen and evaluated Randall Hoffman and discussed their care with the Residency Team. Mr Gus Rankinngarola was admitted for a L hip fracture. He was recently living in [place] and was reportedly admitted for 6 weeks for CVA and complications. He was ambulating but deconditioned at D/C and came [facility], where he has family. We are trying to obtain those records to verify. PT had a bad dream at the SNF an rolled out of bed, fracturing his hip. Dr Magnus IvanBlackman has already operated. This AM, POD #1, he was c/o something hanging off L hip. He was thirsty but did not want to eat bc he never eats breakfast.   PMHx, Fam Hx, and/or Soc Hx : Sz D/O - well controlled on phenobarb, changed to topamax while in hospital. Hep C - chronic. Mar[place], [facility] [place] [facility]. Heart dz in family but he cannot remember details.  Filed Vitals:   10/12/15 0022 10/12/15 0443  BP: 110/63 123/84  Pulse: 97 117  Temp: 97.7 F (36.5 C) 97.4 F (36.3 C)  Resp: 18 18   NAD, answers questions coherently HRRR but there were about 5 or so rapid but regular heart beats during ausculation. Nl MRG ABD + BS, soft, NT Ext no edema Bandage intact and clean  I personally viewed his CXR images and confirmed by reading with the official read. L sided pleural plaques  I personally viewed his EKG and confirmed by reading with the official read. Sinus, nl axis, no ischemic changes  Assessment and Plan: I have seen and evaluated the patient as outlined above. I agree with the formulated Assessment and Plan as detailed in the residents' note, with the following changes:   1. L hip fracture, s/p arthroplasty - This would be a fragility fx as he only fell from bed height. Plain films commented on diffuse osteopenia. Pt states he did have a bone density scan but doesn't remember if in [place] or [place]. The likelihood of  tracking this down is low. We will start oral bisphosphonate at D/C and encourage adequate calcium and Vit D. May F/U with outpt to pursue DEXA. We disuse the etiology that lead to the fall and doubt that this represented a sz - pt states he has bad dreams and he and his daughter were not surprised that he fell out of bed during the dream.  2. S/P L THR - work on post op pain control. At high risk for delirium so blinds were opened. Freq orientation. At high risk for DVT and Dr Magnus IvanBlackman is OK for prophylaxis. DOAC will be easier than warfarin.  3. Epilepsy - cont his home topamax.  4. Chronic Hep C - may be referred to [facility]  Will need to return to SNF once medically stable  Burns SpainElizabeth A Aleza Pew, MD 5/19/201712:23 PM

## 2015-10-12 NOTE — Progress Notes (Signed)
Subjective: Doing well this morning. He has a pulling sensation in his left leg.  Objective: Vital signs in last 24 hours: Filed Vitals:   10/11/15 1956 10/11/15 2013 10/12/15 0022 10/12/15 0443  BP:  111/75 110/63 123/84  Pulse:  90 97 117  Temp: 97.5 F (36.4 C) 97.8 F (36.6 C) 97.7 F (36.5 C) 97.4 F (36.3 C)  TempSrc:  Axillary Axillary Oral  Resp:  16 18 18   SpO2:  100% 100% 99%   Weight change:   Intake/Output Summary (Last 24 hours) at 10/12/15 0738 Last data filed at 10/12/15 0100  Gross per 24 hour  Intake   1520 ml  Output    950 ml  Net    570 ml   General Apperance: NAD HEENT: Normocephalic, atraumatic, anicteric sclera Neck: Supple, trachea midline Lungs: Clear to auscultation bilaterally. No wheezes, rhonchi or rales. Breathing comfortably Heart: Regular rate and rhythm, no murmur/rub/gallop Abdomen: Soft, nontender, nondistended, no rebound/guarding Extremities: Warm and well perfused, no edema. Dressing on left lateral thigh in place. Clean/dry/intact. No surrounding erythema, warmth or drainage.  Skin: No rashes or lesions Neurologic: Alert and interactive. No gross deficits.  Lab Results: Basic Metabolic Panel:  Recent Labs Lab 10/11/15 0541  NA 135  K 3.9  CL 102  CO2 24  GLUCOSE 107*  BUN 19  CREATININE 1.20  CALCIUM 9.2   Liver Function Tests:  Recent Labs Lab 10/11/15 0950  AST 53*  ALT 42  ALKPHOS 76  BILITOT 1.3*  PROT 6.7  ALBUMIN 3.1*   CBC:  Recent Labs Lab 10/11/15 0541  WBC 5.3  NEUTROABS 2.0  HGB 12.9*  HCT 37.6*  MCV 94.2  PLT 150   Coagulation:  Recent Labs Lab 10/11/15 0950  LABPROT 16.7*  INR 1.34   Urine Drug Screen: Drugs of Abuse     Component Value Date/Time   LABOPIA POSITIVE* 10/12/2015 0153   COCAINSCRNUR NONE DETECTED 10/12/2015 0153   LABBENZ NONE DETECTED 10/12/2015 0153   AMPHETMU NONE DETECTED 10/12/2015 0153   THCU NONE DETECTED 10/12/2015 0153   LABBARB POSITIVE*  10/12/2015 0153    Urinalysis:  Recent Labs Lab 10/12/15 0153  COLORURINE AMBER*  LABSPEC 1.022  PHURINE 6.0  GLUCOSEU NEGATIVE  HGBUR NEGATIVE  BILIRUBINUR NEGATIVE  KETONESUR 15*  PROTEINUR NEGATIVE  NITRITE NEGATIVE  LEUKOCYTESUR SMALL*    Studies/Results: Ct Head Wo Contrast  10/11/2015  CLINICAL DATA:  Patient fell out of bed. Now with head and neck pain. EXAM: CT HEAD WITHOUT CONTRAST CT CERVICAL SPINE WITHOUT CONTRAST TECHNIQUE: Multidetector CT imaging of the head and cervical spine was performed following the standard protocol without intravenous contrast. Multiplanar CT image reconstructions of the cervical spine were also generated. COMPARISON:  None. FINDINGS: CT HEAD FINDINGS Diffuse cerebral atrophy. Ventricular dilatation consistent with central atrophy. Low-attenuation changes in the deep white matter consistent with small vessel ischemia. Prominent vascular calcifications. No mass effect or midline shift. No abnormal extra-axial fluid collections. Gray-white matter junctions are distinct. Basal cisterns are not effaced. No evidence of acute intracranial hemorrhage. No depressed skull fractures. Mucosal thickening in the right maxillary antrum. Mastoid air cells are not opacified. CT CERVICAL SPINE FINDINGS There is reversal of the usual cervical lordosis without anterior subluxation. This may be due to patient positioning or degenerative change but ligamentous injury or muscle spasm can also have this appearance and are not excluded. No vertebral compression deformities. Degenerative changes throughout the cervical spine with narrowed interspaces and endplate hypertrophic  changes. Degenerative changes in the facet joints. Uncovertebral and facet joint spurring causes encroachment upon the neural foramina at multiple levels bilaterally. No prevertebral soft tissue swelling. No vertebral compression deformities. C1-2 articulation appears intact. Sclerosis in the right pedicle of  C2 is likely to represent a benign bone island. Soft tissues are unremarkable. Vascular calcifications. IMPRESSION: No acute intracranial abnormalities. Prominent chronic atrophy and small vessel ischemic changes. Nonspecific reversal of the usual cervical lordosis. Diffuse degenerative changes in the cervical spine. No acute displaced fractures identified. Electronically Signed   By: Burman Nieves M.D.   On: 10/11/2015 06:34   Ct Cervical Spine Wo Contrast  10/11/2015  CLINICAL DATA:  Patient fell out of bed. Now with head and neck pain. EXAM: CT HEAD WITHOUT CONTRAST CT CERVICAL SPINE WITHOUT CONTRAST TECHNIQUE: Multidetector CT imaging of the head and cervical spine was performed following the standard protocol without intravenous contrast. Multiplanar CT image reconstructions of the cervical spine were also generated. COMPARISON:  None. FINDINGS: CT HEAD FINDINGS Diffuse cerebral atrophy. Ventricular dilatation consistent with central atrophy. Low-attenuation changes in the deep white matter consistent with small vessel ischemia. Prominent vascular calcifications. No mass effect or midline shift. No abnormal extra-axial fluid collections. Gray-white matter junctions are distinct. Basal cisterns are not effaced. No evidence of acute intracranial hemorrhage. No depressed skull fractures. Mucosal thickening in the right maxillary antrum. Mastoid air cells are not opacified. CT CERVICAL SPINE FINDINGS There is reversal of the usual cervical lordosis without anterior subluxation. This may be due to patient positioning or degenerative change but ligamentous injury or muscle spasm can also have this appearance and are not excluded. No vertebral compression deformities. Degenerative changes throughout the cervical spine with narrowed interspaces and endplate hypertrophic changes. Degenerative changes in the facet joints. Uncovertebral and facet joint spurring causes encroachment upon the neural foramina at  multiple levels bilaterally. No prevertebral soft tissue swelling. No vertebral compression deformities. C1-2 articulation appears intact. Sclerosis in the right pedicle of C2 is likely to represent a benign bone island. Soft tissues are unremarkable. Vascular calcifications. IMPRESSION: No acute intracranial abnormalities. Prominent chronic atrophy and small vessel ischemic changes. Nonspecific reversal of the usual cervical lordosis. Diffuse degenerative changes in the cervical spine. No acute displaced fractures identified. Electronically Signed   By: Burman Nieves M.D.   On: 10/11/2015 06:34   Pelvis Portable  10/11/2015  CLINICAL DATA:  Status post left hip replacement today. Postoperative exam. EXAM: PORTABLE PELVIS 1-2 VIEWS COMPARISON:  None. FINDINGS: Left total hip arthroplasty is in place. There is some gas in the soft tissues from surgery and surgical staples are noted. The device is located and there is no fracture. IMPRESSION: Left total hip replacement without complication. Electronically Signed   By: Drusilla Kanner M.D.   On: 10/11/2015 20:01   Dg Chest Port 1 View  10/11/2015  CLINICAL DATA:  Preoperative evaluation.  History of tobacco use EXAM: PORTABLE CHEST 1 VIEW COMPARISON:  None. FINDINGS: Calcified pleural plaques are noted on the left. There is no edema or consolidation. Heart size and pulmonary vascularity are normal. No adenopathy. There is atherosclerotic calcification in the aorta. No bone lesions are evident. IMPRESSION: Calcified pleural plaques on the left. Question history of asbestos exposure. No edema or consolidation. Electronically Signed   By: Bretta Bang III M.D.   On: 10/11/2015 08:49   Dg Hip Operative Unilat W Or W/o Pelvis Left  10/11/2015  CLINICAL DATA:  Left hip replacement.  Intraoperative imaging. EXAM: OPERATIVE  LEFT HIP (WITH PELVIS IF PERFORMED) 4 VIEWS TECHNIQUE: Fluoroscopic spot image(s) were submitted for interpretation post-operatively.  COMPARISON:  Plain films the early hips 02/11/2016. FINDINGS: We are provided with 4 fluoroscopic intraoperative spot views of the lower pelvis and left hip. Images demonstrate a new left hip arthroplasty in place. No fracture or other acute abnormality is identified. IMPRESSION: Left hip replacement.  No acute finding. Electronically Signed   By: Drusilla Kannerhomas  Dalessio M.D.   On: 10/11/2015 19:05   Dg Hips Bilat With Pelvis 3-4 Views  10/11/2015  CLINICAL DATA:  Initial evaluation for acute seizure, fall. EXAM: DG HIP (WITH OR WITHOUT PELVIS) 3-4V BILAT COMPARISON:  None. FINDINGS: Bones are diffusely osteopenia, somewhat limiting evaluation. There is an acute subcapital fracture of through the left femoral neck with minimal displacement and slight impaction. Femoral head itself remains position within the acetabulum. Femoral head height preserved. Remainder of the visualized left femoral shaft intact. Bony acetabulum on the left is intact. Right hip intact. Right femoral head normally position within the acetabulum. Right femoral head and neck intact. Visualized proximal right femoral shaft intact. Visualized bony pelvis intact.  SI joints approximated. Mild degenerative changes noted within the lower lumbar spine. No acute soft tissue abnormality.  Vascular calcifications noted. IMPRESSION: 1. Acute subcapital fracture of the proximal left femoral neck with slight displacement and impaction. 2. No acute fracture or dislocation about the right hip. 3. Diffuse osteopenia. Electronically Signed   By: Rise MuBenjamin  McClintock M.D.   On: 10/11/2015 06:38   Medications: I have reviewed the patient's current medications. Scheduled Meds: . aspirin EC  325 mg Oral Q breakfast  . atorvastatin  10 mg Oral QHS  . citalopram  10 mg Oral Daily  . feeding supplement (ENSURE ENLIVE)  237 mL Oral Daily  . sodium chloride flush  3 mL Intravenous Q12H  . tamsulosin  0.4 mg Oral QHS  . topiramate  50 mg Oral BID   Continuous  Infusions: . sodium chloride 50 mL/hr at 10/11/15 2051  . lactated ringers 10 mL/hr at 10/11/15 1630   PRN Meds:.acetaminophen **OR** acetaminophen, HYDROcodone-acetaminophen, menthol-cetylpyridinium **OR** phenol, methocarbamol **OR** methocarbamol (ROBAXIN)  IV, metoCLOPramide **OR** metoCLOPramide (REGLAN) injection, morphine injection, ondansetron **OR** ondansetron (ZOFRAN) IV, sodium chloride flush, zolpidem Assessment/Plan: 81 year old man with hx of CVA, seizure disorder, dementia and HCV (reportedly w/ cirrhosis) here from SNF with hip pain s/p fall from bed last night.  Left displaced femoral neck fracture, osteoporosis: No seizure activity noted and patient compliant with AED.Underwent left total hip arthroplasty on 5/18. - PT/OT - Norco 5/325 1-2 tab q6hr prn - Given this is a low trauma fracture, he is considered to have osteoporosis and would qualify for bisphosphonate on discharge. Will plan on discharging on 10 mg once daily or 70 mg once weekly of alendronate.  Hx of seizure: Continue home Topamax  HCV possibly with cirrhosis: Patient reports hx of HCV and says insurance would not cover Harvoni treatment. He thinks he may have cirrhosis. Total bilirubin 1.3, INR 1.34.  - outpatient follow-up with [facility]/hep clinic  Hx of CVA: Continue home ASA daily  Diet: Regular VTE ppx: [place] lovenox Code status: DNR  Dispo: Disposition is deferred at this time, awaiting improvement of current medical problems.  Anticipated discharge in approximately 0-1 day(s).   The patient does not have a current PCP (No primary care provider on file.) and does not need [facility] after discharge.  The patient does not have transportation limitations  that hinder transportation to clinic appointments.  .Services Needed at time of discharge: Y = Yes, Blank = No PT:   OT:   RN:   Equipment:   Other:     LOS: 1 day   Lora Paula, MD 10/12/2015, 7:38 AM

## 2015-10-12 NOTE — Progress Notes (Signed)
Subjective: Randall Hoffman is in moderate pain this morning, with last pain medications given at 3am. He is also mildly confused, stating that "there is something hanging off my leg," though this improved with continued conversation and opening the blinds.   Objective: Vital signs in last 24 hours: Filed Vitals:   10/11/15 1956 10/11/15 2013 10/12/15 0022 10/12/15 0443  BP:  111/75 110/63 123/84  Pulse:  90 97 117  Temp: 97.5 F (36.4 C) 97.8 F (36.6 C) 97.7 F (36.5 C) 97.4 F (36.3 C)  TempSrc:  Axillary Axillary Oral  Resp:  16 18 18   SpO2:  100% 100% 99%   Weight change:   Intake/Output Summary (Last 24 hours) at 10/12/15 0917 Last data filed at 10/12/15 0600  Gross per 24 hour  Intake 2097.5 ml  Output   1050 ml  Net 1047.5 ml   Physical Exam:  General: Lying in bed, mildly confused, but improving throughout conversation HEENT: Moist mucous membranes, oropharynx clear. Sclera anicteric, extraocular movements intact Cardiology: Regular rate and rhythm without murmurs, rubs, or gallops.  Pulmonary: Stable on room air, normal work of breathing Abdomen: Soft, non-tender to palpation, non-distended. No organomegaly or masses.  Extremities: Surgical site on left thigh covered with clean bandage. No pain, erythema, or warmth in the lower extremities GU: No foley catheter in place Neuro: Alert & oriented, though states "I'm feeling confused right now since I was just sleeping." Skin: warm and well-perfused  Interval Lab Results: CBC Latest Ref Rng 10/12/2015 10/11/2015  WBC 4.0 - 10.5 K/uL 6.0 5.3  Hemoglobin 13.0 - 17.0 g/dL 10.4(L) 12.9(L)  Hematocrit 39.0 - 52.0 % 31.5(L) 37.6(L)  Platelets 150 - 400 K/uL 180 150   BMP Latest Ref Rng 10/12/2015 10/11/2015  Glucose 65 - 99 mg/dL 604(V) 409(W)  BUN 6 - 20 mg/dL 16 19  Creatinine 1.19 - 1.24 mg/dL 1.47 8.29  Sodium 562 - 145 mmol/L 136 135  Potassium 3.5 - 5.1 mmol/L 4.2 3.9  Chloride 101 - 111 mmol/L 102 102  CO2 22 -  32 mmol/L 23 24  Calcium 8.9 - 10.3 mg/dL 1.3(Y) 9.2   Urinalysis    Component Value Date/Time   COLORURINE AMBER* 10/12/2015 0153   APPEARANCEUR CLEAR 10/12/2015 0153   LABSPEC 1.022 10/12/2015 0153   PHURINE 6.0 10/12/2015 0153   GLUCOSEU NEGATIVE 10/12/2015 0153   HGBUR NEGATIVE 10/12/2015 0153   BILIRUBINUR NEGATIVE 10/12/2015 0153   KETONESUR 15* 10/12/2015 0153   PROTEINUR NEGATIVE 10/12/2015 0153   NITRITE NEGATIVE 10/12/2015 0153   LEUKOCYTESUR SMALL* 10/12/2015 0153   Drugs of Abuse     Component Value Date/Time   LABOPIA POSITIVE* 10/12/2015 0153   COCAINSCRNUR NONE DETECTED 10/12/2015 0153   LABBENZ NONE DETECTED 10/12/2015 0153   AMPHETMU NONE DETECTED 10/12/2015 0153   THCU NONE DETECTED 10/12/2015 0153   LABBARB POSITIVE* 10/12/2015 0153     Micro Results: Recent Results (from the past 240 hour(s))  Surgical pcr screen     Status: None   Collection Time: 10/11/15  5:11 PM  Result Value Ref Range Status   MRSA, PCR NEGATIVE NEGATIVE Final   Staphylococcus aureus NEGATIVE NEGATIVE Final    Comment:        The Xpert SA Assay (FDA approved for NASAL specimens in patients over 66 years of age), is one component of a comprehensive surveillance program.  Test performance has been validated by [facility] for patients greater than or equal to 76 year old. It is not  intended to diagnose infection nor to guide or monitor treatment.    Studies/Results: Ct Head Wo Contrast  10/11/2015  CLINICAL DATA:  Patient fell out of bed. Now with head and neck pain. EXAM: CT HEAD WITHOUT CONTRAST CT CERVICAL SPINE WITHOUT CONTRAST TECHNIQUE: Multidetector CT imaging of the head and cervical spine was performed following the standard protocol without intravenous contrast. Multiplanar CT image reconstructions of the cervical spine were also generated. COMPARISON:  None. FINDINGS: CT HEAD FINDINGS Diffuse cerebral atrophy. Ventricular dilatation consistent with central atrophy.  Low-attenuation changes in the deep white matter consistent with small vessel ischemia. Prominent vascular calcifications. No mass effect or midline shift. No abnormal extra-axial fluid collections. Gray-white matter junctions are distinct. Basal cisterns are not effaced. No evidence of acute intracranial hemorrhage. No depressed skull fractures. Mucosal thickening in the right maxillary antrum. Mastoid air cells are not opacified. CT CERVICAL SPINE FINDINGS There is reversal of the usual cervical lordosis without anterior subluxation. This may be due to patient positioning or degenerative change but ligamentous injury or muscle spasm can also have this appearance and are not excluded. No vertebral compression deformities. Degenerative changes throughout the cervical spine with narrowed interspaces and endplate hypertrophic changes. Degenerative changes in the facet joints. Uncovertebral and facet joint spurring causes encroachment upon the neural foramina at multiple levels bilaterally. No prevertebral soft tissue swelling. No vertebral compression deformities. C1-2 articulation appears intact. Sclerosis in the right pedicle of C2 is likely to represent a benign bone island. Soft tissues are unremarkable. Vascular calcifications. IMPRESSION: No acute intracranial abnormalities. Prominent chronic atrophy and small vessel ischemic changes. Nonspecific reversal of the usual cervical lordosis. Diffuse degenerative changes in the cervical spine. No acute displaced fractures identified. Electronically Signed   By: Burman Nieves M.D.   On: 10/11/2015 06:34   Ct Cervical Spine Wo Contrast  10/11/2015  CLINICAL DATA:  Patient fell out of bed. Now with head and neck pain. EXAM: CT HEAD WITHOUT CONTRAST CT CERVICAL SPINE WITHOUT CONTRAST TECHNIQUE: Multidetector CT imaging of the head and cervical spine was performed following the standard protocol without intravenous contrast. Multiplanar CT image reconstructions of  the cervical spine were also generated. COMPARISON:  None. FINDINGS: CT HEAD FINDINGS Diffuse cerebral atrophy. Ventricular dilatation consistent with central atrophy. Low-attenuation changes in the deep white matter consistent with small vessel ischemia. Prominent vascular calcifications. No mass effect or midline shift. No abnormal extra-axial fluid collections. Gray-white matter junctions are distinct. Basal cisterns are not effaced. No evidence of acute intracranial hemorrhage. No depressed skull fractures. Mucosal thickening in the right maxillary antrum. Mastoid air cells are not opacified. CT CERVICAL SPINE FINDINGS There is reversal of the usual cervical lordosis without anterior subluxation. This may be due to patient positioning or degenerative change but ligamentous injury or muscle spasm can also have this appearance and are not excluded. No vertebral compression deformities. Degenerative changes throughout the cervical spine with narrowed interspaces and endplate hypertrophic changes. Degenerative changes in the facet joints. Uncovertebral and facet joint spurring causes encroachment upon the neural foramina at multiple levels bilaterally. No prevertebral soft tissue swelling. No vertebral compression deformities. C1-2 articulation appears intact. Sclerosis in the right pedicle of C2 is likely to represent a benign bone island. Soft tissues are unremarkable. Vascular calcifications. IMPRESSION: No acute intracranial abnormalities. Prominent chronic atrophy and small vessel ischemic changes. Nonspecific reversal of the usual cervical lordosis. Diffuse degenerative changes in the cervical spine. No acute displaced fractures identified. Electronically Signed   By: Chrissie Noa  Andria MeuseStevens M.D.   On: 10/11/2015 06:34   Pelvis Portable  10/11/2015  CLINICAL DATA:  Status post left hip replacement today. Postoperative exam. EXAM: PORTABLE PELVIS 1-2 VIEWS COMPARISON:  None. FINDINGS: Left total hip arthroplasty is  in place. There is some gas in the soft tissues from surgery and surgical staples are noted. The device is located and there is no fracture. IMPRESSION: Left total hip replacement without complication. Electronically Signed   By: Drusilla Kannerhomas  Dalessio M.D.   On: 10/11/2015 20:01   Dg Chest Port 1 View  10/11/2015  CLINICAL DATA:  Preoperative evaluation.  History of tobacco use EXAM: PORTABLE CHEST 1 VIEW COMPARISON:  None. FINDINGS: Calcified pleural plaques are noted on the left. There is no edema or consolidation. Heart size and pulmonary vascularity are normal. No adenopathy. There is atherosclerotic calcification in the aorta. No bone lesions are evident. IMPRESSION: Calcified pleural plaques on the left. Question history of asbestos exposure. No edema or consolidation. Electronically Signed   By: Bretta BangWilliam  Woodruff III M.D.   On: 10/11/2015 08:49   Dg Hip Operative Unilat W Or W/o Pelvis Left  10/11/2015  CLINICAL DATA:  Left hip replacement.  Intraoperative imaging. EXAM: OPERATIVE LEFT HIP (WITH PELVIS IF PERFORMED) 4 VIEWS TECHNIQUE: Fluoroscopic spot image(s) were submitted for interpretation post-operatively. COMPARISON:  Plain films the early hips 02/11/2016. FINDINGS: We are provided with 4 fluoroscopic intraoperative spot views of the lower pelvis and left hip. Images demonstrate a new left hip arthroplasty in place. No fracture or other acute abnormality is identified. IMPRESSION: Left hip replacement.  No acute finding. Electronically Signed   By: Drusilla Kannerhomas  Dalessio M.D.   On: 10/11/2015 19:05   Dg Hips Bilat With Pelvis 3-4 Views  10/11/2015  CLINICAL DATA:  Initial evaluation for acute seizure, fall. EXAM: DG HIP (WITH OR WITHOUT PELVIS) 3-4V BILAT COMPARISON:  None. FINDINGS: Bones are diffusely osteopenia, somewhat limiting evaluation. There is an acute subcapital fracture of through the left femoral neck with minimal displacement and slight impaction. Femoral head itself remains position within  the acetabulum. Femoral head height preserved. Remainder of the visualized left femoral shaft intact. Bony acetabulum on the left is intact. Right hip intact. Right femoral head normally position within the acetabulum. Right femoral head and neck intact. Visualized proximal right femoral shaft intact. Visualized bony pelvis intact.  SI joints approximated. Mild degenerative changes noted within the lower lumbar spine. No acute soft tissue abnormality.  Vascular calcifications noted. IMPRESSION: 1. Acute subcapital fracture of the proximal left femoral neck with slight displacement and impaction. 2. No acute fracture or dislocation about the right hip. 3. Diffuse osteopenia. Electronically Signed   By: Rise MuBenjamin  McClintock M.D.   On: 10/11/2015 06:38   Medications:  Scheduled Meds: . aspirin EC  325 mg Oral Q breakfast  . atorvastatin  10 mg Oral QHS  . citalopram  10 mg Oral Daily  . feeding supplement (ENSURE ENLIVE)  237 mL Oral Daily  . sodium chloride flush  3 mL Intravenous Q12H  . tamsulosin  0.4 mg Oral QHS  . topiramate  50 mg Oral BID   Continuous Infusions: . sodium chloride 50 mL/hr at 10/11/15 2051  . lactated ringers 10 mL/hr at 10/11/15 1630   PRN Meds:.acetaminophen **OR** acetaminophen, HYDROcodone-acetaminophen, menthol-cetylpyridinium **OR** phenol, methocarbamol **OR** methocarbamol (ROBAXIN)  IV, metoCLOPramide **OR** metoCLOPramide (REGLAN) injection, morphine injection, ondansetron **OR** ondansetron (ZOFRAN) IV, sodium chloride flush, zolpidem   Assessment/Plan: Randall Hoffman is a 81 yo gentleman with PMH  significant for strokes, seizure disorder, dementia, and HepC presenting with left hip pain after an unwitnessed fall and found to have acute subcapital fracture of the left femoral neck. He is now POD1 from a total hip arthroplasty.   Left subcapital femoral fracture: X-ray findings show slight displacement and impaction. Given relatively low height of fall, concern for  fragility fracture. Orthopedic surgery performed an anterior total hip arthroplasty, which the patient tolerated well. He is in moderate pain this morning, but has adequate PRN dosing available. His hemoglobin dropped to 10.4 from 12.9, however we will continue to monitor as clinically he has no evidence of hemarthrosis.  -Ortho following, we appreciate reccommendations  -PO Tylenol, Norco PRN, Robaxin, IV Morphine PRN -Advance diet as tolerated -Start [facility] Lovenox 30 mg BID for anticoagulation -PT/OT consulted -Consider bisphosphonate therapy and vitamin D/Calcuium supplementation following discharge -AM CBC, BMP   History of stroke: Unclear history. Risk factors include former smoking and age. He was hospitalized for several weeks with a few "losses of consciousness." He is on daily Aspirin for secondary prophylaxis.  -Resume home Aspirin 81 mg   Seizure disorder: Reportedly well-controlled without having any seizure activity in many years. Recently switched from Phenobarbitol to topamax for anti-epileptic control, without issue. Facility staff and EMS do not report any clear post-ictal symptoms after his fall last night, but the patient is unable to recall everything that occurred other than noting fecal incontinence. He does have episodes of both incontinence and confusion at baseline.  -Continue home topamax 50 BID  Hepatitis C: Untreated 2/2 financial constraints. Hepatic function panel shows mildly elevated AST of 53 and ALT of 42. Total bilirubin 1.3 (direct 0.4), INR 1.34.  -Follow-up with ID clinic outpatient  Diet: Liquids, with advance as tolerated -Zofran, reglan PRN for nausea  Prophylaxis: Lovenox [place]  Dispo: Disposition is deferred at this time, awaiting improvement of current medical problems. Anticipated discharge in approximately 2-3 day(s).   The patient does not have a current PCP here in [place] (No primary care provider on file.) and does need an [facility] hospital follow-up  appointment after discharge.  The patient does not have transportation limitations that hinder transportation to clinic appointments.  This is a Psychologist, occupational Note.  The care of the patient was discussed with Dr. Rogelia Boga and the assessment and plan formulated with their assistance.  Please see their attached note for official documentation of the daily encounter.   LOS: 1 day   Jonell Cluck, Med Student 10/12/2015, 9:17 AM

## 2015-10-12 NOTE — Progress Notes (Signed)
Subjective: 1 Day Post-Op Procedure(s) (LRB): TOTAL HIP ARTHROPLASTY ANTERIOR APPROACH (Left) Patient reports pain as moderate.  Awake and alert.  Feels ok.  Acute blood loss anemia from surgery, but vitals stable.  Objective: Vital signs in last 24 hours: Temp:  [97.4 F (36.3 C)-98.4 F (36.9 C)] 97.4 F (36.3 C) (05/19 0443) Pulse Rate:  [64-122] 117 (05/19 0443) Resp:  [12-26] 18 (05/19 0443) BP: (100-137)/(61-93) 123/84 mmHg (05/19 0443) SpO2:  [96 %-100 %] 99 % (05/19 0443)  Intake/Output from previous day: 05/18 0701 - 05/19 0700 In: 2097.5 [P.O.:240; I.V.:1857.5] Out: 1050 [Urine:750; Blood:300] Intake/Output this shift:     Recent Labs  10/11/15 0541 10/12/15 0758  HGB 12.9* 10.4*    Recent Labs  10/11/15 0541 10/12/15 0758  WBC 5.3 6.0  RBC 3.99* 3.33*  HCT 37.6* 31.5*  PLT 150 180    Recent Labs  10/11/15 0541 10/12/15 0758  NA 135 136  K 3.9 4.2  CL 102 102  CO2 24 23  BUN 19 16  CREATININE 1.20 1.18  GLUCOSE 107* 101*  CALCIUM 9.2 8.5*    Recent Labs  10/11/15 0950  INR 1.34    Sensation intact distally Intact pulses distally Dorsiflexion/Plantar flexion intact Incision: dressing C/D/I  Assessment/Plan: 1 Day Post-Op Procedure(s) (LRB): TOTAL HIP ARTHROPLASTY ANTERIOR APPROACH (Left) Up with therapy - WBAT left hip; no hip precautions DVT coverage - ok with Lovenox subQ, not sure what he should be on long-term coverage given his com-morbidities and stroke history.  Normally I treat with Aspirin 325 mg BID, but will leave DVT meds up to primary team, but again, I'm good with Lovenox.  Randall Hoffman Y 10/12/2015, 11:32 AM

## 2015-10-12 NOTE — Progress Notes (Signed)
PT Cancellation Note  Patient Details Name: Randall Hoffman MRN: [id number] DOB: [birth year]   Cancelled Treatment:    Reason Eval/Treat Not Completed: Patient refused to participate with PT X2 attempts. Pt becoming increasingly agitated during education on benefits of mobilization. Pt states, "My fucking leg hurts, I'm not doing this today. Maybe tomorrow". PT to continue to follow for evaluation.     Christiane HaBenjamin J. Harper Smoker, PT, CSCS Pager [phone] Office [phone]  10/12/2015, 12:16 PM

## 2015-10-12 NOTE — Progress Notes (Signed)
Page placed to Dr Isabella BowensKrall, to inform MD of pt has not voided, last I&O cath performed at 0600 by night RN, pt bladder scanned at 1300 for only 25cc, rescanned at 1506 for 135cc.  Pt still receiving NS at 50cc/hr, has ate little today, only wants to eat apple sauce and jello. See intake on I & O's. Awaiting MD to return call from page.

## 2015-10-12 NOTE — Progress Notes (Signed)
OT Cancellation Note  Patient Details Name: Buena IrishDonald Groome MRN: [id number] DOB: 07-14-1934   Cancelled Treatment:    Reason Eval/Treat Not Completed: Patient declined, no reason specified. Pt declined to mobilize OOB at this time due to severe hip pain. RN notified and reported having given pt pain medication already. Pt has also declined to participate with PT x2 today. Will check back for evaluation if time allows.  Nils PyleJulia Latrisha Coiro, OTR/L Pager: [phone] 10/12/2015, 11:52 AM

## 2015-10-12 NOTE — Progress Notes (Signed)
Pt continues to be unable to void; complaining of discomfort and need to urinate, but no success despite attempts this shift. Bladder scan at 2230 revealed 350 cc urine in bladder. As pt has already been in and out cathed twice since admission (see flowsheet), protocol indicates placement of indwelling foley catheter. Pt agreeable. Foley placed. Dark yellow, clear urine immediately returned. Pt tolerated well and not without discomfort or complaints. Nursing will continue to monitor.

## 2015-10-13 ENCOUNTER — Encounter (HOSPITAL_COMMUNITY): Payer: Self-pay | Admitting: Certified Registered"

## 2015-10-13 ENCOUNTER — Inpatient Hospital Stay (HOSPITAL_COMMUNITY): Payer: Medicare Other | Admitting: Anesthesiology

## 2015-10-13 ENCOUNTER — Encounter (HOSPITAL_COMMUNITY)
Admission: EM | Disposition: A | Discharge status: Skilled Nursing Facility | Payer: Self-pay | Source: Home / Self Care | Attending: Internal Medicine

## 2015-10-13 ENCOUNTER — Inpatient Hospital Stay (HOSPITAL_COMMUNITY): Payer: Medicare Other

## 2015-10-13 HISTORY — PX: TOTAL HIP REVISION: SHX763

## 2015-10-13 LAB — CBC
HEMATOCRIT: 28.5 % — AB (ref 39.0–52.0)
Hemoglobin: 9.5 g/dL — ABNORMAL LOW (ref 13.0–17.0)
MCH: 31.4 pg (ref 26.0–34.0)
MCHC: 33.3 g/dL (ref 30.0–36.0)
MCV: 94.1 fL (ref 78.0–100.0)
PLATELETS: 176 10*3/uL (ref 150–400)
RBC: 3.03 MIL/uL — ABNORMAL LOW (ref 4.22–5.81)
RDW: 14 % (ref 11.5–15.5)
WBC: 6.3 10*3/uL (ref 4.0–10.5)

## 2015-10-13 LAB — BASIC METABOLIC PANEL
ANION GAP: 9 (ref 5–15)
BUN: 19 mg/dL (ref 6–20)
CALCIUM: 8.2 mg/dL — AB (ref 8.9–10.3)
CO2: 22 mmol/L (ref 22–32)
CREATININE: 1.19 mg/dL (ref 0.61–1.24)
Chloride: 100 mmol/L — ABNORMAL LOW (ref 101–111)
GFR, EST NON AFRICAN AMERICAN: 57 mL/min — AB (ref >60)
GLUCOSE: 99 mg/dL (ref 65–99)
Potassium: 3.6 mmol/L (ref 3.5–5.1)
Sodium: 131 mmol/L — ABNORMAL LOW (ref 135–145)

## 2015-10-13 SURGERY — TOTAL HIP REVISION
Anesthesia: General | Site: Hip

## 2015-10-13 MED ORDER — FENTANYL CITRATE (PF) 250 MCG/5ML IJ SOLN
INTRAMUSCULAR | Status: AC
  Filled 2015-10-13: qty 5

## 2015-10-13 MED ORDER — GLYCOPYRROLATE 0.2 MG/ML IJ SOLN
INTRAMUSCULAR | Status: DC | PRN
  Administered 2015-10-13: 0.4 mg via INTRAVENOUS

## 2015-10-13 MED ORDER — RIVAROXABAN 10 MG PO TABS: 10.0000 mg | ORAL_TABLET | Freq: Every day | ORAL | Status: DC

## 2015-10-13 MED ORDER — METHOCARBAMOL 500 MG PO TABS: 500.0000 mg | ORAL_TABLET | Freq: Four times a day (QID) | ORAL | Status: DC | PRN

## 2015-10-13 MED ORDER — SODIUM CHLORIDE 0.9 % IR SOLN
Status: DC | PRN
  Administered 2015-10-13: 1000 mL

## 2015-10-13 MED ORDER — ONDANSETRON HCL 4 MG/2ML IJ SOLN
INTRAMUSCULAR | Status: DC | PRN
  Administered 2015-10-13: 4 mg via INTRAVENOUS

## 2015-10-13 MED ORDER — PROPOFOL 10 MG/ML IV BOLUS
INTRAVENOUS | Status: DC | PRN
  Administered 2015-10-13 (×2): 30 mg via INTRAVENOUS
  Administered 2015-10-13: 40 mg via INTRAVENOUS

## 2015-10-13 MED ORDER — ALENDRONATE SODIUM 70 MG PO TABS: 70.0000 mg | ORAL_TABLET | ORAL | Status: DC

## 2015-10-13 MED ORDER — HYDROCODONE-ACETAMINOPHEN 5-325 MG PO TABS: 1.0000 | ORAL_TABLET | Freq: Four times a day (QID) | ORAL | Status: DC | PRN

## 2015-10-13 MED ORDER — ROCURONIUM BROMIDE 50 MG/5ML IV SOLN
INTRAVENOUS | Status: AC
  Filled 2015-10-13: qty 1

## 2015-10-13 MED ORDER — FENTANYL CITRATE (PF) 250 MCG/5ML IJ SOLN
INTRAMUSCULAR | Status: DC | PRN
  Administered 2015-10-13: 100 ug via INTRAVENOUS
  Administered 2015-10-13: 50 ug via INTRAVENOUS

## 2015-10-13 MED ORDER — CEFAZOLIN SODIUM-DEXTROSE 2-3 GM-% IV SOLR
2.0000 g | Freq: Once | INTRAVENOUS | Status: AC
  Administered 2015-10-13: 2 g via INTRAVENOUS

## 2015-10-13 MED ORDER — CEFAZOLIN SODIUM 1 G IJ SOLR
INTRAMUSCULAR | Status: AC
  Filled 2015-10-13: qty 20

## 2015-10-13 MED ORDER — LACTATED RINGERS IV SOLN
INTRAVENOUS | Status: DC
  Administered 2015-10-13: 12:00:00 via INTRAVENOUS

## 2015-10-13 MED ORDER — CEFAZOLIN SODIUM-DEXTROSE 2-4 GM/100ML-% IV SOLN
2.0000 g | Freq: Three times a day (TID) | INTRAVENOUS | Status: AC
  Administered 2015-10-13 – 2015-10-14 (×2): 2 g via INTRAVENOUS
  Filled 2015-10-13 (×2): qty 100

## 2015-10-13 MED ORDER — NEOSTIGMINE METHYLSULFATE 5 MG/5ML IV SOSY
PREFILLED_SYRINGE | INTRAVENOUS | Status: AC
  Filled 2015-10-13: qty 5

## 2015-10-13 MED ORDER — PHENYLEPHRINE HCL 10 MG/ML IJ SOLN
INTRAMUSCULAR | Status: DC | PRN
  Administered 2015-10-13: 160 ug via INTRAVENOUS
  Administered 2015-10-13 (×2): 120 ug via INTRAVENOUS

## 2015-10-13 MED ORDER — PHENYLEPHRINE HCL 10 MG/ML IJ SOLN
10.0000 mg | INTRAVENOUS | Status: DC | PRN
  Administered 2015-10-13: 25 ug/min via INTRAVENOUS

## 2015-10-13 MED ORDER — ONDANSETRON HCL 4 MG/2ML IJ SOLN
INTRAMUSCULAR | Status: AC
  Filled 2015-10-13: qty 2

## 2015-10-13 MED ORDER — ENOXAPARIN SODIUM 40 MG/0.4ML ~~LOC~~ SOLN
40.0000 mg | SUBCUTANEOUS | Status: DC
  Administered 2015-10-14 – 2015-10-15 (×2): 40 mg via SUBCUTANEOUS
  Filled 2015-10-13 (×2): qty 0.4

## 2015-10-13 MED ORDER — ROCURONIUM BROMIDE 100 MG/10ML IV SOLN
INTRAVENOUS | Status: DC | PRN
  Administered 2015-10-13: 30 mg via INTRAVENOUS

## 2015-10-13 MED ORDER — NEOSTIGMINE METHYLSULFATE 10 MG/10ML IV SOLN
INTRAVENOUS | Status: DC | PRN
  Administered 2015-10-13: 2 mg via INTRAVENOUS

## 2015-10-13 MED ORDER — PROPOFOL 10 MG/ML IV BOLUS
INTRAVENOUS | Status: AC
  Filled 2015-10-13: qty 20

## 2015-10-13 MED ORDER — GLYCOPYRROLATE 0.2 MG/ML IV SOSY
PREFILLED_SYRINGE | INTRAVENOUS | Status: AC
  Filled 2015-10-13: qty 3

## 2015-10-13 SURGICAL SUPPLY — 52 items
ARTICULEZE HEAD (Hips) ×4 IMPLANT
BENZOIN TINCTURE PRP APPL 2/3 (GAUZE/BANDAGES/DRESSINGS) ×4 IMPLANT
BLADE SAW SGTL 18X1.27X75 (BLADE) IMPLANT
BLADE SAW SGTL 18X1.27X75MM (BLADE)
BLADE SURG ROTATE 9660 (MISCELLANEOUS) IMPLANT
CELLS DAT CNTRL 66122 CELL SVR (MISCELLANEOUS) ×2 IMPLANT
CLOSURE WOUND 1/2 X4 (GAUZE/BANDAGES/DRESSINGS) ×1
COVER SURGICAL LIGHT HANDLE (MISCELLANEOUS) ×4 IMPLANT
DRAPE C-ARM 42X72 X-RAY (DRAPES) ×4 IMPLANT
DRAPE STERI IOBAN 125X83 (DRAPES) ×4 IMPLANT
DRAPE U-SHAPE 47X51 STRL (DRAPES) ×12 IMPLANT
DRSG AQUACEL AG ADV 3.5X10 (GAUZE/BANDAGES/DRESSINGS) ×4 IMPLANT
DURAPREP 26ML APPLICATOR (WOUND CARE) ×4 IMPLANT
ELECT BLADE 4.0 EZ CLEAN MEGAD (MISCELLANEOUS)
ELECT BLADE 6.5 EXT (BLADE) IMPLANT
ELECT REM PT RETURN 9FT ADLT (ELECTROSURGICAL) ×4
ELECTRODE BLDE 4.0 EZ CLN MEGD (MISCELLANEOUS) IMPLANT
ELECTRODE REM PT RTRN 9FT ADLT (ELECTROSURGICAL) ×2 IMPLANT
FACESHIELD WRAPAROUND (MASK) ×4 IMPLANT
GAUZE XEROFORM 1X8 LF (GAUZE/BANDAGES/DRESSINGS) ×4 IMPLANT
GLOVE BIOGEL PI IND STRL 8 (GLOVE) ×2 IMPLANT
GLOVE BIOGEL PI INDICATOR 8 (GLOVE) ×2
GLOVE ECLIPSE 8.0 STRL XLNG CF (GLOVE) IMPLANT
GLOVE ORTHO TXT STRL SZ7.5 (GLOVE) ×8 IMPLANT
GOWN STRL REUS W/ TWL LRG LVL3 (GOWN DISPOSABLE) ×4 IMPLANT
GOWN STRL REUS W/ TWL XL LVL3 (GOWN DISPOSABLE) ×4 IMPLANT
GOWN STRL REUS W/TWL LRG LVL3 (GOWN DISPOSABLE) ×4
GOWN STRL REUS W/TWL XL LVL3 (GOWN DISPOSABLE) ×4
HANDPIECE INTERPULSE COAX TIP (DISPOSABLE) ×2
HEAD ARTICULEZE (Hips) ×2 IMPLANT
KIT BASIN OR (CUSTOM PROCEDURE TRAY) ×4 IMPLANT
KIT ROOM TURNOVER OR (KITS) ×4 IMPLANT
MANIFOLD NEPTUNE II (INSTRUMENTS) ×4 IMPLANT
NS IRRIG 1000ML POUR BTL (IV SOLUTION) ×4 IMPLANT
PACK TOTAL JOINT (CUSTOM PROCEDURE TRAY) ×4 IMPLANT
PAD ARMBOARD 7.5X6 YLW CONV (MISCELLANEOUS) ×4 IMPLANT
RTRCTR WOUND ALEXIS 18CM MED (MISCELLANEOUS) ×4
SET HNDPC FAN SPRY TIP SCT (DISPOSABLE) ×2 IMPLANT
STAPLER VISISTAT 35W (STAPLE) ×4 IMPLANT
STRIP CLOSURE SKIN 1/2X4 (GAUZE/BANDAGES/DRESSINGS) ×3 IMPLANT
SUT ETHIBOND NAB CT1 #1 30IN (SUTURE) ×4 IMPLANT
SUT MNCRL AB 4-0 PS2 18 (SUTURE) ×4 IMPLANT
SUT VIC AB 0 CT1 27 (SUTURE) ×2
SUT VIC AB 0 CT1 27XBRD ANBCTR (SUTURE) ×2 IMPLANT
SUT VIC AB 1 CT1 27 (SUTURE) ×2
SUT VIC AB 1 CT1 27XBRD ANBCTR (SUTURE) ×2 IMPLANT
SUT VIC AB 2-0 CT1 27 (SUTURE) ×2
SUT VIC AB 2-0 CT1 TAPERPNT 27 (SUTURE) ×2 IMPLANT
TOWEL OR 17X24 6PK STRL BLUE (TOWEL DISPOSABLE) ×4 IMPLANT
TOWEL OR 17X26 10 PK STRL BLUE (TOWEL DISPOSABLE) ×4 IMPLANT
TRAY FOLEY CATH 16FRSI W/METER (SET/KITS/TRAYS/PACK) IMPLANT
WATER STERILE IRR 1000ML POUR (IV SOLUTION) ×4 IMPLANT

## 2015-10-13 NOTE — Progress Notes (Signed)
Patient ID: Randall Hoffman, male   DOB: 06/06/1934, 81 y.o.   MRN: [id number] Sitting in the chair and appears comfortable.  Pain on left hip motion.  Left leg appears a little shorter than the right, so I will order a stat pelvis xray to assess the left hip replacement.  Vitals stable.  Incision looks good and a new dressing is placed.  On Lovenox now, unsure of what long-term DVT coverage should be.  Will default to the primary service for this.

## 2015-10-13 NOTE — Brief Op Note (Signed)
10/11/2015 - 10/13/2015  2:12 PM  PATIENT:  Randall Hoffman  81 y.o. male  PRE-OPERATIVE DIAGNOSIS:   left hip replacement  POST-OPERATIVE DIAGNOSIS:  left hip replacement  PROCEDURE:  Procedure(s):   REVISION OF LEFT HIP BALL (Left)  SURGEON:  Surgeon(s) and Role:    * Kathryne Hitchhristopher Y Jirah Rider, MD - Primary  ANESTHESIA:   general  EBL:  Total I/O In: 500 [I.V.:500] Out: 340 [Urine:300; Blood:40]  COUNTS:  YES  DICTATION: .Other Dictation: Dictation Number [id number]  PLAN OF CARE: Admit to inpatient   PATIENT DISPOSITION:  PACU - hemodynamically stable.   Delay start of Pharmacological VTE agent (>24hrs) due to surgical blood loss or risk of bleeding: no

## 2015-10-13 NOTE — Progress Notes (Signed)
Subjective: Urinary retention yesterday requiring in and out cath. Foley replaced. Up in chair this morning. Pain controlled. Has not had much food but has been tolerating liquids -- says he only eats when he's hungry and he has not been hungry.  Objective: Vital signs in last 24 hours: Filed Vitals:   10/12/15 0443 10/12/15 1300 10/12/15 2108 10/13/15 0507  BP: 123/84 121/70 121/70 119/56  Pulse: 117 116 111 100  Temp: 97.4 F (36.3 C) 97.8 F (36.6 C) 98 F (36.7 C) 98.1 F (36.7 C)  TempSrc: Oral Oral Oral Oral  Resp: Weight:    138 lb (62.596 kg)  SpO2: 99% 96% 100% 98%   Weight change:   Intake/Output Summary (Last 24 hours) at 10/13/15 0827 Last data filed at 10/13/15 0600  Gross per 24 hour  Intake   1920 ml  Output    400 ml  Net   1520 ml   General Apperance: NAD HEENT: Normocephalic, atraumatic, anicteric sclera Neck: Supple, trachea midline Lungs: Clear to auscultation bilaterally. No wheezes, rhonchi or rales. Breathing comfortably Heart: Regular rate and rhythm, no murmur/rub/gallop Abdomen: Soft, nontender, nondistended, no rebound/guarding Extremities: Warm and well perfused, no edema. Dressing on left lateral thigh in place. Clean/dry/intact. No surrounding erythema, warmth or drainage.  Skin: No rashes or lesions Neurologic: Alert and interactive. No gross deficits.  Lab Results: Basic Metabolic Panel:  Recent Labs Lab 10/12/15 0758 10/13/15 0544  NA 136 131*  K 4.2 3.6  CL 102 100*  CO2 23 22  GLUCOSE 101* 99  BUN 16 19  CREATININE 1.18 1.19  CALCIUM 8.5* 8.2*   Liver Function Tests:  Recent Labs Lab 10/11/15 0950  AST 53*  ALT 42  ALKPHOS 76  BILITOT 1.3*  PROT 6.7  ALBUMIN 3.1*   CBC:  Recent Labs Lab 10/11/15 0541 10/12/15 0758 10/13/15 0544  WBC 5.3 6.0 6.3  NEUTROABS 2.0  --   --   HGB 12.9* 10.4* 9.5*  HCT 37.6* 31.5* 28.5*  MCV 94.2 94.6 94.1  PLT 150 180 176   Coagulation:  Recent Labs Lab  10/11/15 0950  LABPROT 16.7*  INR 1.34   Urine Drug Screen: Drugs of Abuse     Component Value Date/Time   LABOPIA POSITIVE* 10/12/2015 0153   COCAINSCRNUR NONE DETECTED 10/12/2015 0153   LABBENZ NONE DETECTED 10/12/2015 0153   AMPHETMU NONE DETECTED 10/12/2015 0153   THCU NONE DETECTED 10/12/2015 0153   LABBARB POSITIVE* 10/12/2015 0153    Urinalysis:  Recent Labs Lab 10/12/15 0153  COLORURINE AMBER*  LABSPEC 1.022  PHURINE 6.0  GLUCOSEU NEGATIVE  HGBUR NEGATIVE  BILIRUBINUR NEGATIVE  KETONESUR 15*  PROTEINUR NEGATIVE  NITRITE NEGATIVE  LEUKOCYTESUR SMALL*    Studies/Results: Pelvis Portable  10/11/2015  CLINICAL DATA:  Status post left hip replacement today. Postoperative exam. EXAM: PORTABLE PELVIS 1-2 VIEWS COMPARISON:  None. FINDINGS: Left total hip arthroplasty is in place. There is some gas in the soft tissues from surgery and surgical staples are noted. The device is located and there is no fracture. IMPRESSION: Left total hip replacement without complication. Electronically Signed   By: Drusilla Kanner M.D.   On: 10/11/2015 20:01   Dg Chest Port 1 View  10/11/2015  CLINICAL DATA:  Preoperative evaluation.  History of tobacco use EXAM: PORTABLE CHEST 1 VIEW COMPARISON:  None. FINDINGS: Calcified pleural plaques are noted on the left. There is no edema or consolidation. Heart size and pulmonary vascularity are  normal. No adenopathy. There is atherosclerotic calcification in the aorta. No bone lesions are evident. IMPRESSION: Calcified pleural plaques on the left. Question history of asbestos exposure. No edema or consolidation. Electronically Signed   By: Bretta BangWilliam  Woodruff III M.D.   On: 10/11/2015 08:49   Dg Hip Operative Unilat W Or W/o Pelvis Left  10/11/2015  CLINICAL DATA:  Left hip replacement.  Intraoperative imaging. EXAM: OPERATIVE LEFT HIP (WITH PELVIS IF PERFORMED) 4 VIEWS TECHNIQUE: Fluoroscopic spot image(s) were submitted for interpretation  post-operatively. COMPARISON:  Plain films the early hips 02/11/2016. FINDINGS: We are provided with 4 fluoroscopic intraoperative spot views of the lower pelvis and left hip. Images demonstrate a new left hip arthroplasty in place. No fracture or other acute abnormality is identified. IMPRESSION: Left hip replacement.  No acute finding. Electronically Signed   By: Drusilla Kannerhomas  Dalessio M.D.   On: 10/11/2015 19:05   Medications: I have reviewed the patient's current medications. Scheduled Meds: . aspirin  81 mg Oral Daily  . atorvastatin  10 mg Oral QHS  . citalopram  10 mg Oral Daily  . enoxaparin (LOVENOX) injection  40 mg Subcutaneous Daily  . feeding supplement (ENSURE ENLIVE)  237 mL Oral Daily  . ketorolac  15 mg Intravenous Q6H  . sodium chloride flush  3 mL Intravenous Q12H  . tamsulosin  0.4 mg Oral QHS  . topiramate  50 mg Oral BID   Continuous Infusions: . sodium chloride 50 mL/hr at 10/12/15 1724  . lactated ringers 10 mL/hr at 10/11/15 1630   PRN Meds:.acetaminophen **OR** acetaminophen, HYDROcodone-acetaminophen, menthol-cetylpyridinium **OR** phenol, methocarbamol **OR** methocarbamol (ROBAXIN)  IV, metoCLOPramide **OR** metoCLOPramide (REGLAN) injection, morphine injection, ondansetron **OR** ondansetron (ZOFRAN) IV, sodium chloride flush, zolpidem Assessment/Plan: 81 year old man with hx of CVA, seizure disorder, dementia and HCV (reportedly w/ cirrhosis) here from SNF with hip pain s/p fall from bed last night.  Left displaced femoral neck fracture, osteoporosis: No seizure activity noted and patient compliant with AED.Underwent left total hip arthroplasty on 5/18. - OT recommending SNF. Awaiting PT. - Norco 5/325 1-2 tab q6hr prn - Toradol 15mg  q6hr - Given this is a low trauma fracture, he is considered to have osteoporosis and would qualify for bisphosphonate on discharge. Will plan on discharging on 70 mg once weekly of alendronate.  Urinary retention:  - Continue home  Flomax 0.4 mg QHS - D/c foley, voiding trial.  Hx of seizure: Continue home Topamax  HCV possibly with cirrhosis: Patient reports hx of HCV and says insurance would not cover Harvoni treatment. He thinks he may have cirrhosis. Total bilirubin 1.3, INR 1.34.  - outpatient follow-up with [facility]/hep clinic  Hx of CVA: Continue home ASA daily, Lipitor daily.  Diet: Regular VTE ppx: Xarelto 10mg  daily for total 35 days post op. Code status: DNR  Dispo: Awaiting SNF  The patient does not have a current PCP (No primary care provider on file.) and [facility] after discharge.  The patient does not have transportation limitations that hinder transportation to clinic appointments.  .Services Needed at time of discharge: Y = Yes, Blank = No PT:   OT:   RN:   Equipment:   Other:     LOS: 2 days   Lora PaulaJennifer T Zhana Jeangilles, MD 10/13/2015, 8:27 AM

## 2015-10-13 NOTE — Anesthesia Preprocedure Evaluation (Addendum)
Anesthesia Evaluation  Patient identified by MRN, date of birth, ID band Patient awake    Reviewed: Allergy & Precautions, NPO status , Patient's Chart, lab work & pertinent test results  History of Anesthesia Complications Negative for: history of anesthetic complications  Airway Mallampati: II  TM Distance: >3 FB Neck ROM: Full    Dental  (+) Poor Dentition, Missing, Dental Advisory Given   Pulmonary former smoker,    breath sounds clear to auscultation       Cardiovascular + Peripheral Vascular Disease   Rhythm:Regular     Neuro/Psych CVA, Residual Symptoms    GI/Hepatic (+) Hepatitis -, C  Endo/Other    Renal/GU      Musculoskeletal   Abdominal   Peds  Hematology   Anesthesia Other Findings   Reproductive/Obstetrics                           Anesthesia Physical Anesthesia Plan  ASA: III  Anesthesia Plan: General   Post-op Pain Management:    Induction: Intravenous  Airway Management Planned: Oral ETT  Additional Equipment: None  Intra-op Plan:   Post-operative Plan: Extubation in OR  Informed Consent: I have reviewed the patients History and Physical, chart, labs and discussed the procedure including the risks, benefits and alternatives for the proposed anesthesia with the patient or authorized representative who has indicated his/her understanding and acceptance.   Dental advisory given  Plan Discussed with: CRNA and Surgeon  Anesthesia Plan Comments:         Anesthesia Quick Evaluation                                  Anesthesia Evaluation  Patient identified by MRN, date of birth, ID band Patient awake    Reviewed: Allergy & Precautions, NPO status , Patient's Chart, lab work & pertinent test results  Airway Mallampati: II  TM Distance: >3 FB Neck ROM: Full    Dental no notable dental hx.    Pulmonary neg pulmonary ROS, former smoker,     Pulmonary exam normal breath sounds clear to auscultation       Cardiovascular negative cardio ROS Normal cardiovascular exam Rhythm:Regular Rate:Normal     Neuro/Psych CVA negative psych ROS   GI/Hepatic negative GI ROS, (+) Hepatitis -  Endo/Other  negative endocrine ROS  Renal/GU negative Renal ROS     Musculoskeletal negative musculoskeletal ROS (+)   Abdominal   Peds  Hematology negative hematology ROS (+)   Anesthesia Other Findings   Reproductive/Obstetrics                             Anesthesia Physical Anesthesia Plan  ASA: III  Anesthesia Plan:    Post-op Pain Management:    Induction:   Airway Management Planned:   Additional Equipment:   Intra-op Plan:   Post-operative Plan:   Informed Consent: I have reviewed the patients History and Physical, chart, labs and discussed the procedure including the risks, benefits and alternatives for the proposed anesthesia with the patient or authorized representative who has indicated his/her understanding and acceptance.   Dental advisory given  Plan Discussed with: CRNA  Anesthesia Plan Comments:         Anesthesia Quick Evaluation

## 2015-10-13 NOTE — Anesthesia Postprocedure Evaluation (Signed)
Anesthesia Post Note  Patient: Buena Irishonald Crampton  Procedure(s) Performed: Procedure(s) (LRB):   REVISION OF LEFT HIP BALL (Left)  Patient location during evaluation: PACU Anesthesia Type: General Level of consciousness: awake and patient cooperative Pain management: pain level controlled Vital Signs Assessment: post-procedure vital signs reviewed and stable Respiratory status: spontaneous breathing Cardiovascular status: stable Postop Assessment: no signs of nausea or vomiting Anesthetic complications: no    Last Vitals:  Filed Vitals:   10/13/15 1504 10/13/15 1507  BP: 83/40 112/52  Pulse:  95  Temp:    Resp:  16    Last Pain:  Filed Vitals:   10/13/15 1511  PainSc: 0-No pain                 Neely Cecena

## 2015-10-13 NOTE — Care Management Important Message (Signed)
Important Message  Patient Details  Name: Buena IrishDonald Ferriss MRN: [id number] Date of Birth: [birth year]   Medicare Important Message Given:  Yes    Durenda GuthrieBrady, Huxton Glaus Naomi, RN 10/13/2015, 11:49 AM

## 2015-10-13 NOTE — Discharge Instructions (Addendum)
Take Fosamax (alendronate) 1 tablet (70 mg total) by mouth once a week. Take with a full glass of water on an empty stomach. This will help your bone strength.  Take Xarelto (rivaroxaban) 1 tablet (10 mg total) by mouth daily with supper. Stop after November 17, 2015. This will help prevent blood clots.   INSTRUCTIONS AFTER JOINT REPLACEMENT   o Remove items at home which could result in a fall. This includes throw rugs or furniture in walking pathways o ICE to the affected joint every three hours while awake for 30 minutes at a time, for at least the first 3-5 days, and then as needed for pain and swelling.  Continue to use ice for pain and swelling. You may notice swelling that will progress down to the foot and ankle.  This is normal after surgery.  Elevate your leg when you are not up walking on it.   o Continue to use the breathing machine you got in the hospital (incentive spirometer) which will help keep your temperature down.  It is common for your temperature to cycle up and down following surgery, especially at night when you are not up moving around and exerting yourself.  The breathing machine keeps your lungs expanded and your temperature down.   DIET:  As you were doing prior to hospitalization, we recommend a well-balanced diet.  DRESSING / WOUND CARE / SHOWERING  Keep the surgical dressing until follow up.  The dressing is water proof, so you can shower without any extra covering.  IF THE DRESSING FALLS OFF or the wound gets wet inside, change the dressing with sterile gauze.  Please use good hand washing techniques before changing the dressing.  Do not use any lotions or creams on the incision until instructed by your surgeon.    ACTIVITY  o Increase activity slowly as tolerated, but follow the weight bearing instructions below.   o No driving for 6 weeks or until further direction given by your physician.  You cannot drive while taking narcotics.  o No lifting or carrying greater  than 10 lbs. until further directed by your surgeon. o Avoid periods of inactivity such as sitting longer than an hour when not asleep. This helps prevent blood clots.  o You may return to work once you are authorized by your doctor.     WEIGHT BEARING   Weight bearing as tolerated with assist device (walker, cane, etc) as directed, use it as long as suggested by your surgeon or therapist, typically at least 4-6 weeks.   EXERCISES  Results after joint replacement surgery are often greatly improved when you follow the exercise, range of motion and muscle strengthening exercises prescribed by your doctor. Safety measures are also important to protect the joint from further injury. Any time any of these exercises cause you to have increased pain or swelling, decrease what you are doing until you are comfortable again and then slowly increase them. If you have problems or questions, call your caregiver or physical therapist for advice.   Rehabilitation is important following a joint replacement. After just a few days of immobilization, the muscles of the leg can become weakened and shrink (atrophy).  These exercises are designed to build up the tone and strength of the thigh and leg muscles and to improve motion. Often times heat used for twenty to thirty minutes before working out will loosen up your tissues and help with improving the range of motion but do not use heat for the  first two weeks following surgery (sometimes heat can increase post-operative swelling).   These exercises can be done on a training (exercise) mat, on the floor, on a table or on a bed. Use whatever works the best and is most comfortable for you.    Use music or television while you are exercising so that the exercises are a pleasant break in your day. This will make your life better with the exercises acting as a break in your routine that you can look forward to.   Perform all exercises about fifteen times, three times per  day or as directed.  You should exercise both the operative leg and the other leg as well.  Exercises include:    Quad Sets - Tighten up the muscle on the front of the thigh (Quad) and hold for 5-10 seconds.    Straight Leg Raises - With your knee straight (if you were given a brace, keep it on), lift the leg to 60 degrees, hold for 3 seconds, and slowly lower the leg.  Perform this exercise against resistance later as your leg gets stronger.   Leg Slides: Lying on your back, slowly slide your foot toward your buttocks, bending your knee up off the floor (only go as far as is comfortable). Then slowly slide your foot back down until your leg is flat on the floor again.   Angel Wings: Lying on your back spread your legs to the side as far apart as you can without causing discomfort.   Hamstring Strength:  Lying on your back, push your heel against the floor with your leg straight by tightening up the muscles of your buttocks.  Repeat, but this time bend your knee to a comfortable angle, and push your heel against the floor.  You may put a pillow under the heel to make it more comfortable if necessary.   A rehabilitation program following joint replacement surgery can speed recovery and prevent re-injury in the future due to weakened muscles. Contact your doctor or a physical therapist for more information on knee rehabilitation.    CONSTIPATION  Constipation is defined medically as fewer than three stools per week and severe constipation as less than one stool per week.  Even if you have a regular bowel pattern at home, your normal regimen is likely to be disrupted due to multiple reasons following surgery.  Combination of anesthesia, postoperative narcotics, change in appetite and fluid intake all can affect your bowels.   YOU MUST use at least one of the following options; they are listed in order of increasing strength to get the job done.  They are all available over the counter, and you may  need to use some, POSSIBLY even all of these options:    Drink plenty of fluids (prune juice may be helpful) and high fiber foods Colace 100 mg by mouth twice a day  Senokot for constipation as directed and as needed Dulcolax (bisacodyl), take with full glass of water  Miralax (polyethylene glycol) once or twice a day as needed.  If you have tried all these things and are unable to have a bowel movement in the first 3-4 days after surgery call either your surgeon or your primary doctor.    If you experience loose stools or diarrhea, hold the medications until you stool forms back up.  If your symptoms do not get better within 1 week or if they get worse, check with your doctor.  If you experience "the worst abdominal pain  ever" or develop nausea or vomiting, please contact the office immediately for further recommendations for treatment.   ITCHING:  If you experience itching with your medications, try taking only a single pain pill, or even half a pain pill at a time.  You can also use Benadryl over the counter for itching or also to help with sleep.   TED HOSE STOCKINGS:  Use stockings on both legs until for at least 2 weeks or as directed by physician office. They may be removed at night for sleeping.  MEDICATIONS:  See your medication summary on the After Visit Summary that nursing will review with you.  You may have some home medications which will be placed on hold until you complete the course of blood thinner medication.  It is important for you to complete the blood thinner medication as prescribed.  PRECAUTIONS:  If you experience chest pain or shortness of breath - call 911 immediately for transfer to the hospital emergency department.   If you develop a fever greater that 101 F, purulent drainage from wound, increased redness or drainage from wound, foul odor from the wound/dressing, or calf pain - CONTACT YOUR SURGEON.                                                   FOLLOW-UP  APPOINTMENTS:  If you do not already have a post-op appointment, please call the office for an appointment to be seen by your surgeon.  Guidelines for how soon to be seen are listed in your After Visit Summary, but are typically between 1-4 weeks after surgery.  OTHER INSTRUCTIONS:   Knee Replacement:  Do not place pillow under knee, focus on keeping the knee straight while resting. CPM instructions: 0-90 degrees, 2 hours in the morning, 2 hours in the afternoon, and 2 hours in the evening. Place foam block, curve side up under heel at all times except when in CPM or when walking.  DO NOT modify, tear, cut, or change the foam block in any way.  MAKE SURE YOU:   Understand these instructions.   Get help right away if you are not doing well or get worse.    Thank you for letting us be a part of your medical care team.  It is a privilege we respect greatly.  We hope these instructions will help you stay on track for a fast and full recovery!     Information on my medicine - XARELTO (Rivaroxaban)  This medication education was reviewed with me or my healthcare representative as part of my discharge preparation.   Why was Xarelto prescribed for you? Xarelto was prescribed for you to reduce the risk of blood clots forming after orthopedic surgery. The medical term for these abnormal blood clots is venous thromboembolism (VTE).  What do you need to know about xarelto ? Take your Xarelto ONCE DAILY at the same time every day. You may take it either with or without food.  If you have difficulty swallowing the tablet whole, you may crush it and mix in applesauce just prior to taking your dose.  Take Xarelto exactly as prescribed by your doctor and DO NOT stop taking Xarelto without talking to the doctor who prescribed the medication.  Stopping without other VTE prevention medication to take the place of Xarelto may increase your risk of developing  a clot.  After discharge, you should  have regular check-up appointments with your healthcare provider that is prescribing your Xarelto.    What do you do if you miss a dose? If you miss a dose, take it as soon as you remember on the same day then continue your regularly scheduled once daily regimen the next day. Do not take two doses of Xarelto on the same day.   Important Safety Information A possible side effect of Xarelto is bleeding. You should call your healthcare provider right away if you experience any of the following: ? Bleeding from an injury or your nose that does not stop. ? Unusual colored urine (red or dark brown) or unusual colored stools (red or black). ? Unusual bruising for unknown reasons. ? A serious fall or if you hit your head (even if there is no bleeding).  Some medicines may interact with Xarelto and might increase your risk of bleeding while on Xarelto. To help avoid this, consult your healthcare provider or pharmacist prior to using any new prescription or non-prescription medications, including herbals, vitamins, non-steroidal anti-inflammatory drugs (NSAIDs) and supplements.  This website has more information on Xarelto: VisitDestination.com.br.

## 2015-10-13 NOTE — Progress Notes (Signed)
Patient ID: Randall IrishDonald Hoffman, male   DOB: 1934-12-11, 81 y.o.   MRN: [id number] I did review the x-ray of [name]'s left hip and confirmed my suspicion; his left hip is dislocted.  He has not had any type of fall since surgery on Thursday, but his left leg was shortened on exam this am.  I have spoken to him in length and am trying to get a hold of his daughter.  He needs to be taken down to the OR for a closed reduction of this hip, and most likely, and exchange of his hip ball and liner to tighten up his tissues for more stability.

## 2015-10-13 NOTE — Transfer of Care (Signed)
Immediate Anesthesia Transfer of Care Note  Patient: Randall Hoffman  Procedure(s) Performed: Procedure(s):   REVISION OF LEFT HIP BALL (Left)  Patient Location: PACU  Anesthesia Type:General  Level of Consciousness: awake  Airway & Oxygen Therapy: Patient Spontanous Breathing and Patient connected to nasal cannula oxygen  Post-op Assessment: Report given to RN  Post vital signs: Reviewed and stable  Last Vitals:  Filed Vitals:   10/13/15 0507 10/13/15 1144  BP: 119/56 112/68  Pulse: 100 100  Temp: 36.7 C 36.7 C  Resp: 16 16    Last Pain:  Filed Vitals:   10/13/15 1146  PainSc: 0-No pain      Patients Stated Pain Goal: 3 (10/13/15 0004)  Complications: No apparent anesthesia complications

## 2015-10-13 NOTE — Progress Notes (Signed)
Patient refuses to wear nasal cannula and continuous pulse oximetry.  Educated the patient on the importance of both, teach back also used.  Will continue to monitor.  Vital signs stable on room air.

## 2015-10-13 NOTE — [place] FL2 (Addendum)
[place] MEDICAID FL2 LEVEL OF CARE SCREENING TOOL     IDENTIFICATION  Patient Name: Randall Hoffman Birthdate: [birth year] Sex: male Admission Date (Current Location): 10/11/2015  [facility] and [place] Number:  Producer, television/film/video and Address:  [facility], [address], [place], [place] [address]      Provider Number: [id number]  Attending Physician Name and Address:  Burns Spain, MD  Relative Name and Phone Number:       Current Level of Care: Hospital Recommended Level of Care: Assisted Living Facility Prior Approval Number:    Date Approved/Denied:   PASRR Number:  [id number]  Discharge Plan: SNF   Current Diagnoses: Patient Active Problem List   Diagnosis Date Noted  . Atherosclerosis of aorta (HCC) 10/12/2015  . Left displaced femoral neck fracture (HCC) 10/11/2015  . Seizure disorder (HCC) 10/11/2015  . Hepatitis C 10/11/2015  . History of stroke in prior 3 months 10/11/2015    Orientation RESPIRATION BLADDER Height & Weight     Self, Time, Situation, Place  Normal Incontinent, Indwelling catheter Weight: 138 lb (62.596 kg) Height:     BEHAVIORAL SYMPTOMS/MOOD NEUROLOGICAL BOWEL NUTRITION STATUS   (NONE) Convulsions/Seizures Continent Diet (REGULAR)  AMBULATORY STATUS COMMUNICATION OF NEEDS Skin   Limited Assist Verbally Surgical wounds                       Personal Care Assistance Level of Assistance  Bathing, Feeding, Dressing Bathing Assistance: Limited assistance Feeding assistance: Independent Dressing Assistance: Limited assistance     Functional Limitations Info  Sight, Hearing, Speech Sight Info: Adequate Hearing Info: Adequate Speech Info: Adequate    SPECIAL CARE FACTORS FREQUENCY  PT (By licensed PT), OT (By licensed OT)     PT Frequency: Pt has declined PT OT Frequency: Pt has declined OT            Contractures      Additional Factors Info  Code Status, Allergies Code Status  Info: DNR Allergies Info: N/A           Current Medications (10/13/2015):  This is the current hospital active medication list Current Facility-Administered Medications  Medication Dose Route Frequency Provider Last Rate Last Dose  . 0.9 %  sodium chloride infusion   Intravenous Continuous Kathryne Hitch, MD 50 mL/hr at 10/12/15 1724    . acetaminophen (TYLENOL) tablet 650 mg  650 mg Oral Q6H PRN Yolanda Manges, DO       Or  . acetaminophen (TYLENOL) suppository 650 mg  650 mg Rectal Q6H PRN Yolanda Manges, DO      . aspirin chewable tablet 81 mg  81 mg Oral Daily Lora Paula, MD   81 mg at 10/13/15 0943  . atorvastatin (LIPITOR) tablet 10 mg  10 mg Oral QHS Yolanda Manges, DO   10 mg at 10/12/15 2216  . citalopram (CELEXA) tablet 10 mg  10 mg Oral Daily Yolanda Manges, DO   10 mg at 10/13/15 0943  . enoxaparin (LOVENOX) injection 40 mg  40 mg Subcutaneous Daily Lora Paula, MD   40 mg at 10/13/15 0944  . feeding supplement (ENSURE ENLIVE) (ENSURE ENLIVE) liquid 237 mL  237 mL Oral Daily Yolanda Manges, DO   237 mL at 10/12/15 0940  . HYDROcodone-acetaminophen (NORCO/VICODIN) 5-325 MG per tablet 1-2 tablet  1-2 tablet Oral Q6H PRN Kathryne Hitch, MD   2 tablet at 10/12/15 1954  .  ketorolac (TORADOL) 15 MG/ML injection 15 mg  15 mg Intravenous Q6H Lora PaulaJennifer T Krall, MD   15 mg at 10/13/15 0524  . lactated ringers infusion   Intravenous Continuous Lewie LoronJohn Germeroth, MD 10 mL/hr at 10/11/15 1630    . menthol-cetylpyridinium (CEPACOL) lozenge 3 mg  1 lozenge Oral PRN Kathryne Hitchhristopher Y Blackman, MD       Or  . phenol (CHLORASEPTIC) mouth spray 1 spray  1 spray Mouth/Throat PRN Kathryne Hitchhristopher Y Blackman, MD      . methocarbamol (ROBAXIN) tablet 500 mg  500 mg Oral Q6H PRN Kathryne Hitchhristopher Y Blackman, MD   500 mg at 10/12/15 1954   Or  . methocarbamol (ROBAXIN) 500 mg in dextrose 5 % 50 mL IVPB  500 mg Intravenous Q6H PRN Kathryne Hitchhristopher Y Blackman, MD      . metoCLOPramide (REGLAN) tablet 5-10  mg  5-10 mg Oral Q8H PRN Kathryne Hitchhristopher Y Blackman, MD       Or  . metoCLOPramide (REGLAN) injection 5-10 mg  5-10 mg Intravenous Q8H PRN Kathryne Hitchhristopher Y Blackman, MD      . ondansetron [facility](ZOFRAN) tablet 4 mg  4 mg Oral Q6H PRN Kathryne Hitchhristopher Y Blackman, MD       Or  . ondansetron [facility](ZOFRAN) injection 4 mg  4 mg Intravenous Q6H PRN Kathryne Hitchhristopher Y Blackman, MD      . sodium chloride flush (NS) 0.9 % injection 3 mL  3 mL Intravenous Q12H Yolanda MangesAlex M Wilson, DO   3 mL at 10/13/15 0945  . sodium chloride flush (NS) 0.9 % injection 3 mL  3 mL Intravenous PRN Yolanda MangesAlex M Wilson, DO      . tamsulosin [facility](FLOMAX) capsule 0.4 mg  0.4 mg Oral QHS Yolanda MangesAlex M Wilson, DO   0.4 mg at 10/12/15 2216  . topiramate (TOPAMAX) tablet 50 mg  50 mg Oral BID Yolanda MangesAlex M Wilson, DO   50 mg at 10/13/15 0944  . zolpidem (AMBIEN) tablet 5 mg  5 mg Oral QHS PRN Kathryne Hitchhristopher Y Blackman, MD   5 mg at 10/13/15 0154     Discharge Medications: Please see discharge summary for a list of discharge medications.  Relevant Imaging Results:  Relevant Lab Results:   Additional Information [id number]  Derenda FennelBashira Nixon, MSW, LCSWA [phone] 10/13/2015 10:00 AM

## 2015-10-13 NOTE — Progress Notes (Signed)
Occupational Therapy Evaluation Patient Details Name: Randall Hoffman MRN: [id number] DOB: 05/06/1935 Today's Date: 10/13/2015    History of Present Illness 81 y.o. male s/p L THA after a L femoral neck fx from a fall. PMH significant for stroke, seizure disorder, and hepatitis C.    Clinical Impression   Unsure of pt's PLOF and home setup information as pt recently moved to [place] from [place] and no family was present during OT evaluation. Pt currently requires mod-max +2 assist for bed mobility, functional transfers and LB ADLs. Pt will benefit from continued acute OT to increase independence and safety with ADLs and mobility. Recommend SNF for post-acute rehab stay due to pt's need for increased physical assistance and unknown social supports.     Follow Up Recommendations  SNF;Supervision/Assistance - 24 hour    Equipment Recommendations  Other (comment) (TBD in next venue)    Recommendations for Other Services       Precautions / Restrictions Precautions Precautions: Fall Restrictions Weight Bearing Restrictions: Yes LLE Weight Bearing: Weight bearing as tolerated      Mobility Bed Mobility Overal bed mobility: Needs Assistance Bed Mobility: Supine to Sit     Supine to sit: Mod assist;+2 for physical assistance     General bed mobility comments: HOB flat, use of bedrails, exited on R side. Assist to pivot and scoot hips EOB with bed pad and for trunk support to come to sitting position. Pt with significant posterior lean in sitting and required min-mod assist to maintain upright position. Verbal cues for proper hand placement throughout.  Transfers Overall transfer level: Needs assistance Equipment used: 2 person hand held assist Transfers: Sit to/from [employer] Sit to Stand: Max assist;+2 physical assistance Stand pivot transfers: Max assist;+2 physical assistance       General transfer comment: x3 unsuccessful attempts due to pt's decreased  attention. Max verbal cues for hand placement and for anterior translation prior to start of transfer. Max +2 assist for boost to stand and for balance. Pt placing little to no weight on LLE despite cues to do so.     Balance Overall balance assessment: Needs assistance Sitting-balance support: Feet supported;Bilateral upper extremity supported Sitting balance-Leahy Scale: Poor Sitting balance - Comments: Posterior lean even with both hands holding onto bedrail Postural control: Posterior lean Standing balance support: Bilateral upper extremity supported;During functional activity Standing balance-Leahy Scale: Zero                              ADL Overall ADL's : Needs assistance/impaired                         Toilet Transfer: Maximal assistance;+2 for physical assistance;Cueing for safety Toilet Transfer Details (indicate cue type and reason): simulated to chair Toileting- Clothing Manipulation and Hygiene: Maximal assistance;+2 for physical assistance;Sit to/from stand       Functional mobility during ADLs: Maximal assistance;+2 for physical assistance General ADL Comments: No family present for OT eval.     Vision Vision Assessment?: No apparent visual deficits   Perception     Praxis      Pertinent Vitals/Pain Pain Assessment: Faces Faces Pain Scale: Hurts even more Pain Location: L hip and bilateral knees Pain Descriptors / Indicators: Aching;Grimacing;Guarding Pain Intervention(s): Limited activity within patient's tolerance;Premedicated before session;Monitored during session;Repositioned     Hand Dominance Right   Extremity/Trunk Assessment Upper Extremity Assessment Upper Extremity Assessment: Generalized  weakness   Lower Extremity Assessment Lower Extremity Assessment: LLE deficits/detail;Defer to PT evaluation LLE Deficits / Details: decreased ROM and strength as expected post op, pt reports his thigh is numb   Cervical / Trunk  Assessment Cervical / Trunk Assessment: Kyphotic   Communication Communication Communication: No difficulties   Cognition Arousal/Alertness: Awake/alert Behavior During Therapy: Flat affect Overall Cognitive Status: Impaired/Different from baseline Area of Impairment: Orientation;Attention;Memory;Following commands;Problem solving Orientation Level: Disoriented to;Place Current Attention Level: Sustained Memory: Decreased short-term memory Following Commands: Follows one step commands inconsistently;Follows one step commands with increased time     Problem Solving: Slow processing;Decreased initiation;Difficulty sequencing;Requires verbal cues General Comments: Pt appears confused at times, easily distracted, and required multi-modal cues to maintain attention to task.   General Comments       Exercises       Shoulder Instructions      Home Living Family/patient expects to be discharged to:: Unsure                                 Additional Comments: At SNF for stroke rehab where fall occurred.      Prior Functioning/Environment Level of Independence: Needs assistance        Comments: Unsure what level of assistance pt required prior to fall and surgery    OT Diagnosis: Generalized weakness;Cognitive deficits;Acute pain   OT Problem List: Decreased strength;Decreased range of motion;Decreased activity tolerance;Impaired balance (sitting and/or standing);Decreased safety awareness;Decreased knowledge of use of DME or AE;Decreased knowledge of precautions;Pain;Decreased cognition   OT Treatment/Interventions: Self-care/ADL training;Therapeutic exercise;Energy conservation;DME and/or AE instruction;Therapeutic activities;Balance training;Patient/family education    OT Goals(Current goals can be found in the care plan section) Acute Rehab OT Goals Patient Stated Goal: to be independent again OT Goal Formulation: With patient Time For Goal Achievement:  10/27/15 Potential to Achieve Goals: Good ADL Goals Pt Will Perform Grooming: with set-up;sitting Pt Will Perform Lower Body Bathing: with mod assist;sit to/from stand Pt Will Perform Lower Body Dressing: with mod assist;sit to/from stand Pt Will Transfer to Toilet: with mod assist;stand pivot transfer;bedside commode Pt Will Perform Toileting - Clothing Manipulation and hygiene: with mod assist;sit to/from stand  OT Frequency: Min 2X/week   Barriers to D/C: Decreased caregiver support  Unsure of available family assistance. [place]       Co-evaluation              End of Session Equipment Utilized During Treatment: Gait belt Nurse Communication: Mobility status;Other (comment) (Pt's HR increased to 130-140's during transfer)  Activity Tolerance: Patient limited by pain;Patient limited by fatigue Patient left: in chair;with call bell/phone within reach;with chair alarm set   Time: [phone] OT Time Calculation (min): 19 min Charges:  OT General Charges $OT Visit: 1 Procedure OT Evaluation $OT Eval Moderate Complexity: 1 Procedure G-Codes:    Nils PyleJulia Albi Rappaport, OTR/L Pager: [phone]: [phone] 10/13/2015, 10:45 AM

## 2015-10-13 NOTE — Anesthesia Procedure Notes (Signed)
Procedure Name: Intubation Date/Time: 10/13/2015 12:44 PM Performed by: De NurseENNIE, Macy Polio E Pre-anesthesia Checklist: Patient identified, Emergency Drugs available, Suction available, Patient being monitored and Timeout performed Patient Re-evaluated:Patient Re-evaluated prior to inductionOxygen Delivery Method: Circle system utilized Preoxygenation: Pre-oxygenation with 100% oxygen Intubation Type: IV induction Ventilation: Mask ventilation without difficulty Laryngoscope Size: Mac and 3 Grade View: Grade I Tube type: Oral Tube size: 7.5 mm Number of attempts: 1 Airway Equipment and Method: Stylet Placement Confirmation: ETT inserted through vocal cords under direct vision,  positive ETCO2 and breath sounds checked- equal and bilateral Secured at: 21 cm Tube secured with: Tape Dental Injury: Teeth and Oropharynx as per pre-operative assessment

## 2015-10-13 NOTE — Discharge Summary (Addendum)
Name: Randall Hoffman MRN: [id number] DOB: 07-29-1934 81 y.o. PCP: No primary care provider on file.  Date of Admission: 10/11/2015  4:50 AM Date of Discharge: 10/16/2015 Attending Physician: Burns SpainElizabeth A Metha Kolasa, MD  Discharge Diagnosis: 1. Left displaced femoral neck fracture 2. Seizure disorder 3. History of CVA 4. Hepatitis C 5. Dementia 6. Back pain 7. Scoliosis  Discharge Medications:   Medication List    TAKE these medications        acetaminophen 500 MG tablet  Commonly known as:  TYLENOL  Take 1,000 mg by mouth 2 (two) times daily.     alendronate 70 MG tablet  Commonly known as:  FOSAMAX  Take 1 tablet (70 mg total) by mouth once a week. Take with a full glass of water on an empty stomach.     aspirin 81 MG chewable tablet  Chew 81 mg by mouth daily.     atorvastatin 10 MG tablet  Commonly known as:  LIPITOR  Take 10 mg by mouth daily.     citalopram 10 MG tablet  Commonly known as:  CELEXA  Take 10 mg by mouth daily.     ENSURE  Take 237 mLs by mouth daily.     HYDROcodone-acetaminophen 5-325 MG tablet  Commonly known as:  NORCO/VICODIN  Take 1-2 tablets by mouth every 6 (six) hours as needed for moderate pain.     methocarbamol 500 MG tablet  Commonly known as:  ROBAXIN  Take 1 tablet (500 mg total) by mouth every 6 (six) hours as needed for muscle spasms.     rivaroxaban 10 MG Tabs tablet  Commonly known as:  XARELTO  Take 1 tablet (10 mg total) by mouth daily with supper. Stop after November 17, 2015     tamsulosin 0.4 MG Caps capsule  Commonly known as:  FLOMAX  Take 0.4 mg by mouth at bedtime.     topiramate 50 MG tablet  Commonly known as:  TOPAMAX  Take 50 mg by mouth 2 (two) times daily.        Disposition and follow-up:   Mr.Randall Hoffman was discharged from [facility] in Stable condition.  At the hospital follow up visit please address:  1.  Left displaced femoral neck fracture, s/p total hip arthroplasty:  Please check on mobility and functional status. Given the height of his fall, it is likely this a fragility fracture/osteoporosis. He was started on alendronate. Started on Xarelto for VTE prophylaxis on discharge.   Seizure disorder: Well-controlled for many years on Phenobarbitol, recently switched to Topamax for formulary reasons. Recent fall not likely related to seizure, but as it was unwitnessed.  Hepatitis C: Untreated due to financial/insurance constraints with affording [facility]. Referred to [facility], who will follow-up and schedule an appointment with him.   2.  Labs / imaging needed at time of follow-up: CBC  3.  Pending labs/ test needing follow-up: None  Follow-up Appointments: Follow-up Information    Follow up with Staci RighterOMER, ROBERT, MD.   Specialty:  Infectious Diseases   Why:  They will call you to set up the appointment   Contact information:   [address] [place] [address] [phone]       Follow up with Hyacinth MeekerAhmed, Tasrif, MD On 10/26/2015.   Specialty:  Internal Medicine   Why:  3:15pm   Contact information:   [address] [place] [address] [phone]       Schedule an appointment as soon as  possible for a visit with Kathryne Hitch, MD.   Specialty:  Orthopedic Surgery   Why:  for follow up of hip surgery   Contact information:   [address] [place] [address] [phone]       Discharge Instructions: Discharge Instructions    Call MD for:  difficulty breathing, headache or visual disturbances    Complete by:  As directed      Call MD for:  persistant dizziness or light-headedness    Complete by:  As directed      Call MD for:  persistant nausea and vomiting    Complete by:  As directed      Call MD for:  redness, tenderness, or signs of infection (pain, swelling, redness, odor or green/yellow discharge around incision site)    Complete by:  As directed      Call MD for:  severe uncontrolled pain    Complete by:  As  directed      Call MD for:  temperature >100.4    Complete by:  As directed      Diet - low sodium heart healthy    Complete by:  As directed      Full weight bearing    Complete by:  As directed      Increase activity slowly    Complete by:  As directed            Consultations: Dr. Magnus Ivan, orthopedic surgery.  Procedures Performed:  Ct Head Wo Contrast  10/11/2015  CLINICAL DATA:  Patient fell out of bed. Now with head and neck pain. EXAM: CT HEAD WITHOUT CONTRAST CT CERVICAL SPINE WITHOUT CONTRAST TECHNIQUE: Multidetector CT imaging of the head and cervical spine was performed following the standard protocol without intravenous contrast. Multiplanar CT image reconstructions of the cervical spine were also generated. COMPARISON:  None. FINDINGS: CT HEAD FINDINGS Diffuse cerebral atrophy. Ventricular dilatation consistent with central atrophy. Low-attenuation changes in the deep white matter consistent with small vessel ischemia. Prominent vascular calcifications. No mass effect or midline shift. No abnormal extra-axial fluid collections. Gray-white matter junctions are distinct. Basal cisterns are not effaced. No evidence of acute intracranial hemorrhage. No depressed skull fractures. Mucosal thickening in the right maxillary antrum. Mastoid air cells are not opacified. CT CERVICAL SPINE FINDINGS There is reversal of the usual cervical lordosis without anterior subluxation. This may be due to patient positioning or degenerative change but ligamentous injury or muscle spasm can also have this appearance and are not excluded. No vertebral compression deformities. Degenerative changes throughout the cervical spine with narrowed interspaces and endplate hypertrophic changes. Degenerative changes in the facet joints. Uncovertebral and facet joint spurring causes encroachment upon the neural foramina at multiple levels bilaterally. No prevertebral soft tissue swelling. No vertebral compression  deformities. C1-2 articulation appears intact. Sclerosis in the right pedicle of C2 is likely to represent a benign bone island. Soft tissues are unremarkable. Vascular calcifications. IMPRESSION: No acute intracranial abnormalities. Prominent chronic atrophy and small vessel ischemic changes. Nonspecific reversal of the usual cervical lordosis. Diffuse degenerative changes in the cervical spine. No acute displaced fractures identified. Electronically Signed   By: Burman Nieves M.D.   On: 10/11/2015 06:34   Ct Cervical Spine Wo Contrast  10/11/2015  CLINICAL DATA:  Patient fell out of bed. Now with head and neck pain. EXAM: CT HEAD WITHOUT CONTRAST CT CERVICAL SPINE WITHOUT CONTRAST TECHNIQUE: Multidetector CT imaging of the head and cervical spine was performed following the standard protocol  without intravenous contrast. Multiplanar CT image reconstructions of the cervical spine were also generated. COMPARISON:  None. FINDINGS: CT HEAD FINDINGS Diffuse cerebral atrophy. Ventricular dilatation consistent with central atrophy. Low-attenuation changes in the deep white matter consistent with small vessel ischemia. Prominent vascular calcifications. No mass effect or midline shift. No abnormal extra-axial fluid collections. Gray-white matter junctions are distinct. Basal cisterns are not effaced. No evidence of acute intracranial hemorrhage. No depressed skull fractures. Mucosal thickening in the right maxillary antrum. Mastoid air cells are not opacified. CT CERVICAL SPINE FINDINGS There is reversal of the usual cervical lordosis without anterior subluxation. This may be due to patient positioning or degenerative change but ligamentous injury or muscle spasm can also have this appearance and are not excluded. No vertebral compression deformities. Degenerative changes throughout the cervical spine with narrowed interspaces and endplate hypertrophic changes. Degenerative changes in the facet joints. Uncovertebral  and facet joint spurring causes encroachment upon the neural foramina at multiple levels bilaterally. No prevertebral soft tissue swelling. No vertebral compression deformities. C1-2 articulation appears intact. Sclerosis in the right pedicle of C2 is likely to represent a benign bone island. Soft tissues are unremarkable. Vascular calcifications. IMPRESSION: No acute intracranial abnormalities. Prominent chronic atrophy and small vessel ischemic changes. Nonspecific reversal of the usual cervical lordosis. Diffuse degenerative changes in the cervical spine. No acute displaced fractures identified. Electronically Signed   By: Burman Nieves M.D.   On: 10/11/2015 06:34   Dg Pelvis Portable  10/13/2015  CLINICAL DATA:  Status post surgical reduction of the dislocated left hip prosthesis. EXAM: PORTABLE PELVIS 1-2 VIEWS COMPARISON:  10/11/2015 FINDINGS: On the 2 AP views acquired, the femoral and acetabular prosthetic components are well aligned. There is no acute fracture. There is no evidence of prosthetic loosening. IMPRESSION: Well aligned left hip prosthesis. Electronically Signed   By: Amie Portland M.D.   On: 10/13/2015 14:45   Pelvis Portable  10/11/2015  CLINICAL DATA:  Status post left hip replacement today. Postoperative exam. EXAM: PORTABLE PELVIS 1-2 VIEWS COMPARISON:  None. FINDINGS: Left total hip arthroplasty is in place. There is some gas in the soft tissues from surgery and surgical staples are noted. The device is located and there is no fracture. IMPRESSION: Left total hip replacement without complication. Electronically Signed   By: Drusilla Kanner M.D.   On: 10/11/2015 20:01   Dg Chest Port 1 View  10/11/2015  CLINICAL DATA:  Preoperative evaluation.  History of tobacco use EXAM: PORTABLE CHEST 1 VIEW COMPARISON:  None. FINDINGS: Calcified pleural plaques are noted on the left. There is no edema or consolidation. Heart size and pulmonary vascularity are normal. No adenopathy. There is  atherosclerotic calcification in the aorta. No bone lesions are evident. IMPRESSION: Calcified pleural plaques on the left. Question history of asbestos exposure. No edema or consolidation. Electronically Signed   By: Bretta Bang III M.D.   On: 10/11/2015 08:49   Dg Hip Operative Unilat With Pelvis Left  10/13/2015  CLINICAL DATA:  It s/p surgical reduction of a hip prosthesis dislocation. EXAM: OPERATIVE left HIP (WITH PELVIS IF PERFORMED) 3 VIEWS TECHNIQUE: Fluoroscopic spot image(s) were submitted for interpretation post-operatively. COMPARISON:  10/13/2015 at 9:49 a.m. FINDINGS: The femoral acetabular prosthetic components have been reduced into normal alignment. There is no acute fracture. There is no evidence of prosthetic component loosening. IMPRESSION: Well aligned left hip prosthesis following surgical reduction. Electronically Signed   By: Amie Portland M.D.   On: 10/13/2015 14:44   Dg  Hip Operative Unilat W Or W/o Pelvis Left  10/11/2015  CLINICAL DATA:  Left hip replacement.  Intraoperative imaging. EXAM: OPERATIVE LEFT HIP (WITH PELVIS IF PERFORMED) 4 VIEWS TECHNIQUE: Fluoroscopic spot image(s) were submitted for interpretation post-operatively. COMPARISON:  Plain films the early hips 02/11/2016. FINDINGS: We are provided with 4 fluoroscopic intraoperative spot views of the lower pelvis and left hip. Images demonstrate a new left hip arthroplasty in place. No fracture or other acute abnormality is identified. IMPRESSION: Left hip replacement.  No acute finding. Electronically Signed   By: Drusilla Kanner M.D.   On: 10/11/2015 19:05   Dg Hip Unilat With Pelvis 2-3 Views Left  10/13/2015  CLINICAL DATA:  Status post left total hip arthroplasty 10/11/2015. EXAM: DG HIP (WITH OR WITHOUT PELVIS) 2-3V LEFT COMPARISON:  10/11/2015 pelvic radiograph FINDINGS: There is superolateral dislocation of the left femoral head prosthesis at the left hip joint. The left acetabular prosthesis appears well  positioned. No evidence of prosthetic fracture or loosening. No new osseous fracture or suspicious focal osseous lesion. Skin staples overlie the left hip. Soft tissue emphysema lateral to the left hip. IMPRESSION: Superolateral dislocation of the left femoral head prosthesis at the left hip joint. These results will be called to the ordering clinician or representative by the Radiologist Assistant, and communication documented in the PACS or zVision Dashboard. Electronically Signed   By: Delbert Phenix M.D.   On: 10/13/2015 10:24   Dg Hips Bilat With Pelvis 3-4 Views  10/11/2015  CLINICAL DATA:  Initial evaluation for acute seizure, fall. EXAM: DG HIP (WITH OR WITHOUT PELVIS) 3-4V BILAT COMPARISON:  None. FINDINGS: Bones are diffusely osteopenia, somewhat limiting evaluation. There is an acute subcapital fracture of through the left femoral neck with minimal displacement and slight impaction. Femoral head itself remains position within the acetabulum. Femoral head height preserved. Remainder of the visualized left femoral shaft intact. Bony acetabulum on the left is intact. Right hip intact. Right femoral head normally position within the acetabulum. Right femoral head and neck intact. Visualized proximal right femoral shaft intact. Visualized bony pelvis intact.  SI joints approximated. Mild degenerative changes noted within the lower lumbar spine. No acute soft tissue abnormality.  Vascular calcifications noted. IMPRESSION: 1. Acute subcapital fracture of the proximal left femoral neck with slight displacement and impaction. 2. No acute fracture or dislocation about the right hip. 3. Diffuse osteopenia. Electronically Signed   By: Rise Mu M.D.   On: 10/11/2015 06:38    Admission HPI: 81 year old man with hx of CVA, seizure disorder, dementia and HCV (reportedly w/ cirrhosis) here with hip pain s/p fall from bed last night. The patient recently arrived in [place] from [place] where he has been living  the past 40 years. Reportedly, he was very ill in the past few weeks and required hospital admission. Patient reports LE weakness, decreased appetite, increased fatigue and increased confusion in recent weeks. He remembers being in bed last night and had an episode of fecal incontinence, which he says is not unusual for him. He felt like he was dreaming and rolled out of the bed onto the floor. He hit his head on hard floor but denies LOC. He does not remember any tonic-clonic movements. He cannot remember when his last seizure was. EMS was called and attempted to bring him to ED but he declined thinking that his injuries were not that bad. He developed worsening pain so eventually was transported to ED. He reports using a cane to ambulate but  otherwise lives in his own apartment in [place] and takes care of his own ADLs. He uses the bus for transportation because he cannot drive given seizure history. He ambulates at home and to and from bus stop but is not very active.  Patient agreeable to me discussing medical hx with his daughter. I spoke to her by phone and she reports he has had 2 recent CVAs in past 2 months and was at [facility] in [place] for the past 6 weeks. He has had difficulty ambulating during this time.  Hospital Course by problem list:   1. Left femoral neck fracture, s/p total hip arthroplasty 5/18: Patient admitted with left hip pain following a fall from his bed. Thought to be due to a vivid dream. Orthopedic surgery evaluated him and performed total hip arthroplasty the evening of admission. He did well post-operatively and was controlled on only oral pain medications. On POD 2 he was found to have a shortened limb. XR of his hip revealed superolateral dislocation of the left femoral head prostehesis at the left hip joint. He proceeded to the OR on 5/20 for revision. His postoperative course was also complicated by urinary retention. He was continued on  his home Flomax. He had a Foley replaced 5/19. Foley was removed 5/21 and he was voiding without difficulty afterwards. He was given Lovenox while inpatient for VTE prophylaxis, and he was discharged on Xarelto 10mg  daily for 35 days post THA. He worked with PT/OT who recommended SNF. He will follow-up with orthopedics postoperatively. He is a new resident to [facility] and thus will also follow-up with Dr. Tasia Catchings on June 2nd to establish a PCP.   2. Osteoporosis: Given this is a low trauma fracture, he is considered to have osteoporosis and would qualify for bisphosphonate on discharge. Discharged on 70 mg once weekly of alendronate.  Discharge Vitals:   BP 108/58 mmHg  Pulse 99  Temp(Src) 99.5 F (37.5 C) (Oral)  Resp 16  Wt 138 lb (62.596 kg)  SpO2 100%  Discharge Labs:  Results for orders placed or performed during the hospital encounter of 10/11/15 (from the past 24 hour(s))  Basic metabolic panel     Status: Abnormal   Collection Time: 10/15/15  3:04 AM  Result Value Ref Range   Sodium 130 (L) 135 - 145 mmol/L   Potassium 3.8 3.5 - 5.1 mmol/L   Chloride 101 101 - 111 mmol/L   CO2 22 22 - 32 mmol/L   Glucose, Bld 90 65 - 99 mg/dL   BUN 11 6 - 20 mg/dL   Creatinine, Ser 1.61 0.61 - 1.24 mg/dL   Calcium 8.1 (L) 8.9 - 10.3 mg/dL   GFR calc non Af Amer >60 >60 mL/min   GFR calc Af Amer >60 >60 mL/min   Anion gap 7 5 - 15  Magnesium     Status: None   Collection Time: 10/15/15  3:04 AM  Result Value Ref Range   Magnesium 1.9 1.7 - 2.4 mg/dL  CBC     Status: Abnormal   Collection Time: 10/15/15  3:04 AM  Result Value Ref Range   WBC 4.9 4.0 - 10.5 K/uL   RBC 2.63 (L) 4.22 - 5.81 MIL/uL   Hemoglobin 8.3 (L) 13.0 - 17.0 g/dL   HCT 09.6 (L) 04.5 - 40.9 %   MCV 92.4 78.0 - 100.0 fL   MCH 31.6 26.0 - 34.0 pg   MCHC 34.2 30.0 - 36.0 g/dL  RDW 13.9 11.5 - 15.5 %   Platelets 192 150 - 400 K/uL    Signed: Lora Paula, MD 10/15/2015, 11:44 AM    Services Ordered on  Discharge: None Equipment Ordered on Discharge: None

## 2015-10-13 NOTE — Progress Notes (Signed)
Patient is a resident of [facility].  He has been taken to surgery this afternoon for a revision of left hip ball., left hip replacement. Anticipate need for SNF for rehab at d/c.  CSW is attempting to [facility] with [facility] to obtain phone number for family as none is listed on chart.  Fl2 has been initiated for patient and updated for SNF.  Active bed search will need to be initiated once discussed with patient and family.  Lorri Frederickonna T. Jaci LazierCrowder, [place] [phone]

## 2015-10-13 NOTE — Progress Notes (Signed)
PT Cancellation Note  Patient Details Name: Buena IrishDonald Jeng MRN: [id number] DOB: 1935/01/31   Cancelled Treatment:     Pt already back to bed.   Laying in dark - I tried to have him just stand up and I would do eval to assist with DC needs and pt got aggitated and said his legs are numb and he doesn't want to move right now.  Will try back in 1-2 hours.  Nursing aware   Judson RochHildreth, Devota Viruet Gardner 10/13/2015, 10:40 AM

## 2015-10-14 LAB — CBC
HEMATOCRIT: 23.1 % — AB (ref 39.0–52.0)
HEMOGLOBIN: 7.9 g/dL — AB (ref 13.0–17.0)
MCH: 31.9 pg (ref 26.0–34.0)
MCHC: 34.2 g/dL (ref 30.0–36.0)
MCV: 93.1 fL (ref 78.0–100.0)
Platelets: 153 10*3/uL (ref 150–400)
RBC: 2.48 MIL/uL — AB (ref 4.22–5.81)
RDW: 13.7 % (ref 11.5–15.5)
WBC: 5.1 10*3/uL (ref 4.0–10.5)

## 2015-10-14 LAB — BASIC METABOLIC PANEL
ANION GAP: 6 (ref 5–15)
BUN: 12 mg/dL (ref 6–20)
CHLORIDE: 99 mmol/L — AB (ref 101–111)
CO2: 24 mmol/L (ref 22–32)
CREATININE: 0.97 mg/dL (ref 0.61–1.24)
Calcium: 7.9 mg/dL — ABNORMAL LOW (ref 8.9–10.3)
GFR calc non Af Amer: >60 mL/min (ref >60)
Glucose, Bld: 110 mg/dL — ABNORMAL HIGH (ref 65–99)
POTASSIUM: 3.4 mmol/L — AB (ref 3.5–5.1)
SODIUM: 129 mmol/L — AB (ref 135–145)

## 2015-10-14 MED ORDER — POTASSIUM CHLORIDE CRYS ER 20 MEQ PO TBCR
20.0000 meq | EXTENDED_RELEASE_TABLET | Freq: Once | ORAL | Status: AC
  Administered 2015-10-14: 20 meq via ORAL
  Filled 2015-10-14: qty 1

## 2015-10-14 NOTE — Evaluation (Signed)
Physical Therapy Evaluation Patient Details Name: Randall Hoffman MRN: [id number] DOB: 05-05-1935 Today's Date: 10/14/2015   History of Present Illness  81 y.o. male s/p L THA after a L femoral neck fx from a fall. 5/20 imaging confirmed post dislocation; now s/p Open reduction and exchange of hip ball, left hip, Post Prec PMH significant for stroke, seizure disorder, and hepatitis C.   Clinical Impression  Patient is s/p above surgery resulting in functional limitations due to the deficits listed below (see PT Problem List).  Patient will benefit from skilled PT to increase their independence and safety with mobility to allow discharge to the venue listed below.    Will need post-acute rehab at SNF to maximize independence and safety with mobility, prior to dc home.  [name] adamantly declined getting OOB today; This eval was conducted at bed level, and we will need continues assessment of mobility;     Follow Up Recommendations SNF    Equipment Recommendations  Rolling walker with 5" wheels;3in1 (PT)    Recommendations for Other Services       Precautions / Restrictions Precautions Precautions: Fall;Posterior Hip Precaution Booklet Issued: Yes (comment) Precaution Comments: Needs reinforcement Required Braces or Orthoses: Knee Immobilizer - Left Restrictions Weight Bearing Restrictions: Yes LLE Weight Bearing: Weight bearing as tolerated      Mobility  Bed Mobility                  Transfers                    Ambulation/Gait                Stairs            Wheelchair Mobility    Modified Rankin (Stroke Patients Only)       Balance                                             Pertinent Vitals/Pain Pain Assessment: 0-10 Pain Score: 9  Pain Location: L hip pain after therapeutic exercise; session began with pain at 10/10 Pain Descriptors / Indicators: Aching;Grimacing;Guarding Pain Intervention(s):  Premedicated before session;Monitored during session;Repositioned    Home Living Family/patient expects to be discharged to:: Skilled nursing facility                 Additional Comments: At SNF for stroke rehab where fall occurred.  [name] was not forthcoming with information.    Prior Function Level of Independence: Needs assistance   Gait / Transfers Assistance Needed: didn't give much detail, but he did tell me he uses a RW     Comments: Unsure what level of assistance pt required prior to fall and surgery; he was unable to give a ballpark of how much he would typically walk prior to CVA     Hand Dominance   Dominant Hand: Right    Extremity/Trunk Assessment   Upper Extremity Assessment: Generalized weakness           Lower Extremity Assessment: LLE deficits/detail   LLE Deficits / Details: Decr AROM and strength, limited by pain postop  Cervical / Trunk Assessment: Kyphotic  Communication   Communication: No difficulties  Cognition Arousal/Alertness: Awake/alert Behavior During Therapy: Flat affect (almost became agitated at the notion of getting up) Overall Cognitive Status: No family/caregiver present to determine baseline cognitive functioning  Area of Impairment: Memory;Safety/judgement;Awareness                    General Comments General comments (skin integrity, edema, etc.): Declining mobility today, but did give good effort at  Therapeutic exercise    Exercises Total Joint Exercises Ankle Circles/Pumps: AROM;Both;10 reps Quad Sets: AROM;Both;10 reps Towel Squeeze: AROM;Both;10 reps (quite weak) Short Arc Quad: AAROM;Left;10 reps (quite weak) Heel Slides: AAROM;Left;10 reps (limited range) Hip ABduction/ADduction: AAROM;Left;10 reps (quite weak) Straight Leg Raises: AAROM;Left;10 reps      Assessment/Plan    PT Assessment Patient needs continued PT services  PT Diagnosis Acute pain;Generalized weakness   PT Problem List  Decreased strength;Decreased range of motion;Decreased activity tolerance;Decreased balance;Decreased mobility;Decreased coordination;Decreased cognition;Decreased knowledge of use of DME;Decreased safety awareness;Decreased knowledge of precautions;Pain  PT Treatment Interventions DME instruction;Gait training;Functional mobility training;Therapeutic activities;Therapeutic exercise;Balance training;Cognitive remediation;Patient/family education   PT Goals (Current goals can be found in the Care Plan section) Acute Rehab PT Goals Patient Stated Goal: Did not state PT Goal Formulation: Patient unable to participate in goal setting Time For Goal Achievement: 10/28/15 Potential to Achieve Goals: Fair    Frequency Min 3X/week   Barriers to discharge        Co-evaluation               End of Session Equipment Utilized During Treatment: Gait belt;Left knee immobilizer Activity Tolerance: Patient tolerated treatment well;Other (comment) (declining OOB today despite 2 persons to help him) Patient left: in bed;with call bell/phone within reach Nurse Communication: Mobility status         Time: 0902-0930 PT Time Calculation (min) (ACUTE ONLY): 28 min   Charges:   PT Evaluation $PT Eval Moderate Complexity: 1 Procedure PT Treatments $Therapeutic Exercise: 8-22 mins   PT G Codes:        Olen Pel 10/14/2015, 10:23 AM  Van Clines, PT  Acute Rehabilitation Services Pager [phone] Office [phone]

## 2015-10-14 NOTE — Progress Notes (Signed)
Patient ID: Randall Hoffman, Randall Hoffman   DOB: 1934-09-26, 81 y.o.   MRN: [id number] Postoperative day 1 status post revision left hip for dislocated left hip arthroplasty. Patient is alert oriented comfortable this morning. Plan for physical therapy progressive ambulation weightbearing as tolerated.

## 2015-10-14 NOTE — Progress Notes (Signed)
Subjective: Doing well this morning. Pain controlled. Refusing to get out of bed with PT but willing to work in bed.  Objective: Vital signs in last 24 hours: Filed Vitals:   10/13/15 1507 10/13/15 2208 10/14/15 0200 10/14/15 0553  BP: 112/52 100/49 102/49 89/44  Pulse: 95 103 100 87  Temp:  97.6 F (36.4 C) 97.5 F (36.4 C) 97.7 F (36.5 C)  TempSrc:  Oral Axillary Oral  Resp: Weight:      SpO2: 100% 100% 100% 100%   Weight change:   Intake/Output Summary (Last 24 hours) at 10/14/15 1034 Last data filed at 10/14/15 0554  Gross per 24 hour  Intake   1660 ml  Output   2190 ml  Net   -530 ml   General Apperance: NAD HEENT: Normocephalic, atraumatic, anicteric sclera Neck: Supple, trachea midline Lungs: Clear to auscultation bilaterally. No wheezes, rhonchi or rales. Breathing comfortably Heart: Regular rate and rhythm, no murmur/rub/gallop Abdomen: Soft, nontender, nondistended, no rebound/guarding Extremities: Warm and well perfused, no edema. Dressing in place. Clean/dry/intact. No surrounding erythema, warmth or drainage.  Skin: No rashes or lesions Neurologic: Alert and interactive. No gross deficits.  Lab Results: Basic Metabolic Panel:  Recent Labs Lab 10/13/15 0544 10/14/15 0427  NA 131* 129*  K 3.6 3.4*  CL 100* 99*  CO2 22 24  GLUCOSE 99 110*  BUN 19 12  CREATININE 1.19 0.97  CALCIUM 8.2* 7.9*   Liver Function Tests:  Recent Labs Lab 10/11/15 0950  AST 53*  ALT 42  ALKPHOS 76  BILITOT 1.3*  PROT 6.7  ALBUMIN 3.1*   CBC:  Recent Labs Lab 10/11/15 0541  10/13/15 0544 10/14/15 0427  WBC 5.3  < > 6.3 5.1  NEUTROABS 2.0  --   --   --   HGB 12.9*  < > 9.5* 7.9*  HCT 37.6*  < > 28.5* 23.1*  MCV 94.2  < > 94.1 93.1  PLT 150  < > 176 153  < > = values in this interval not displayed. Coagulation:  Recent Labs Lab 10/11/15 0950  LABPROT 16.7*  INR 1.34   Urine Drug Screen: Drugs of Abuse     Component Value Date/Time    LABOPIA POSITIVE* 10/12/2015 0153   COCAINSCRNUR NONE DETECTED 10/12/2015 0153   LABBENZ NONE DETECTED 10/12/2015 0153   AMPHETMU NONE DETECTED 10/12/2015 0153   THCU NONE DETECTED 10/12/2015 0153   LABBARB POSITIVE* 10/12/2015 0153    Urinalysis:  Recent Labs Lab 10/12/15 0153  COLORURINE AMBER*  LABSPEC 1.022  PHURINE 6.0  GLUCOSEU NEGATIVE  HGBUR NEGATIVE  BILIRUBINUR NEGATIVE  KETONESUR 15*  PROTEINUR NEGATIVE  NITRITE NEGATIVE  LEUKOCYTESUR SMALL*    Studies/Results: Dg Pelvis Portable  10/13/2015  CLINICAL DATA:  Status post surgical reduction of the dislocated left hip prosthesis. EXAM: PORTABLE PELVIS 1-2 VIEWS COMPARISON:  10/11/2015 FINDINGS: On the 2 AP views acquired, the femoral and acetabular prosthetic components are well aligned. There is no acute fracture. There is no evidence of prosthetic loosening. IMPRESSION: Well aligned left hip prosthesis. Electronically Signed   By: Amie Portland M.D.   On: 10/13/2015 14:45   Dg Hip Operative Unilat With Pelvis Left  10/13/2015  CLINICAL DATA:  It s/p surgical reduction of a hip prosthesis dislocation. EXAM: OPERATIVE left HIP (WITH PELVIS IF PERFORMED) 3 VIEWS TECHNIQUE: Fluoroscopic spot image(s) were submitted for interpretation post-operatively. COMPARISON:  10/13/2015 at 9:49 a.m. FINDINGS: The femoral acetabular prosthetic components have  been reduced into normal alignment. There is no acute fracture. There is no evidence of prosthetic component loosening. IMPRESSION: Well aligned left hip prosthesis following surgical reduction. Electronically Signed   By: Amie Portlandavid  Ormond M.D.   On: 10/13/2015 14:44   Dg Hip Unilat With Pelvis 2-3 Views Left  10/13/2015  CLINICAL DATA:  Status post left total hip arthroplasty 10/11/2015. EXAM: DG HIP (WITH OR WITHOUT PELVIS) 2-3V LEFT COMPARISON:  10/11/2015 pelvic radiograph FINDINGS: There is superolateral dislocation of the left femoral head prosthesis at the left hip joint. The  left acetabular prosthesis appears well positioned. No evidence of prosthetic fracture or loosening. No new osseous fracture or suspicious focal osseous lesion. Skin staples overlie the left hip. Soft tissue emphysema lateral to the left hip. IMPRESSION: Superolateral dislocation of the left femoral head prosthesis at the left hip joint. These results will be called to the ordering clinician or representative by the Radiologist Assistant, and communication documented in the PACS or zVision Dashboard. Electronically Signed   By: Delbert PhenixJason A Poff M.D.   On: 10/13/2015 10:24   Medications: I have reviewed the patient's current medications. Scheduled Meds: . aspirin  81 mg Oral Daily  . atorvastatin  10 mg Oral QHS  . citalopram  10 mg Oral Daily  . enoxaparin (LOVENOX) injection  40 mg Subcutaneous Q24H  . feeding supplement (ENSURE ENLIVE)  237 mL Oral Daily  . ketorolac  15 mg Intravenous Q6H  . sodium chloride flush  3 mL Intravenous Q12H  . tamsulosin  0.4 mg Oral QHS  . topiramate  50 mg Oral BID   Continuous Infusions:   PRN Meds:.acetaminophen **OR** acetaminophen, HYDROcodone-acetaminophen, menthol-cetylpyridinium **OR** phenol, methocarbamol **OR** methocarbamol (ROBAXIN)  IV, metoCLOPramide **OR** metoCLOPramide (REGLAN) injection, ondansetron **OR** ondansetron (ZOFRAN) IV, sodium chloride flush, zolpidem Assessment/Plan: [age] year old man with hx of CVA, seizure disorder, dementia and HCV (reportedly w/ cirrhosis) here from SNF with hip pain s/p fall from bed last night.  Left displaced femoral neck fracture, osteoporosis: No seizure activity noted and patient compliant with AED.Underwent left total hip arthroplasty on 5/18. Found to have displaced femoral head prosthesis on 5/20. Proceeded to OR for revision. - PT/OT recommending SNF. - Norco 5/325 1-2 tab q6hr prn - Toradol 15mg  q6hr - Given this is a low trauma fracture, he is considered to have osteoporosis and would qualify for  bisphosphonate on discharge. Will plan on discharging on 70 mg once weekly of alendronate.  Urinary retention:  - Continue home Flomax 0.4 mg QHS - D/c foley, voiding trial.  Hx of seizure: Continue home Topamax  HCV possibly with cirrhosis: Patient reports hx of HCV and says insurance would not cover Harvoni treatment. He thinks he may have cirrhosis. Total bilirubin 1.3, INR 1.34.  - outpatient follow-up with [facility]/hep clinic  Hx of CVA: Continue home ASA daily, Lipitor daily.  Diet: Regular VTE ppx: Xarelto 10mg  daily for total 35 days post op. Code status: DNR  Dispo: Awaiting SNF  The patient does not have a current PCP (No primary care provider on file.) and does not need [facility] after discharge.  The patient does not have transportation limitations that hinder transportation to clinic appointments.  .Services Needed at time of discharge: Y = Yes, Blank = No PT:   OT:   RN:   Equipment:   Other:     LOS: 3 days   Lora PaulaJennifer T Krall, MD 10/14/2015, 10:34 AM

## 2015-10-14 NOTE — Op Note (Signed)
[name], [name] NO.:  [id number]  MEDICAL RECORD NO.:  [id number]  LOCATION:  5N11C                        FACILITY:  [facility]  PHYSICIAN:  Randall Panda. Magnus Ivan, M.D.DATE OF BIRTH:  06-17-1934  DATE OF PROCEDURE:  10/13/2015 DATE OF DISCHARGE:                              OPERATIVE REPORT   PREOPERATIVE DIAGNOSIS:  Left prosthetic hip joint dislocation.  POSTOPERATIVE DIAGNOSIS:  Left prosthetic hip joint dislocation.  PROCEDURE:  Open reduction and exchange of hip ball, left hip.  IMPLANTS:  Exchange of 36+ 1.5 metal hip ball to a 36+ 5 metal hip ball.  FINDINGS:  Posterior hip dislocation and weak muscle status post anterior hip replacement.  SURGEON:  Randall Panda. Magnus Ivan, MD  ANESTHESIA:  General.  ANTIBIOTICS:  2 g IV Ancef.  BLOOD LOSS:  50 mL.  COMPLICATIONS:  None.  INDICATIONS:  Randall Hoffman is 81 year old gentleman who 7 weeks ago had a stroke.  This affected his left side and he had left lower extremity weakness.  He lived in [place] previously and his daughter brought him back to [place] and on Wednesday, he was admitted to skilled nursing facility.  By Thursday, he had fallen out of bed and he was brought to the [facility] Emergency Room.  X-rays showed displaced femoral neck fracture.  We recommended hip replacement surgery through a direct anterior approach given his hip fracture and given the weakness of his muscle groups.  Surgery was performed successfully and I felt that the hip was a stable hip.  When I was rounding today, he was sitting in a chair and he has not walked and weak, when he was sitting in a chair I felt the leg length was significantly short.  He had a little bit of discomfort, but an x-ray was obtained and felt it was a dislocated hip.  Difficult to tell if this was anterior versus posterior, but it is definitely superior and so I recommended given that we needed to increase his leg length and  offset, so he would not dislocate again most likely under such low energy circumstances and I surmise that his muscles must be just significantly that weak and atrophied from his stroke.  I talked to him about this in detail, as well as his wife and his daughter and they did understand the reason behind proceeding with surgery.  PROCEDURE DESCRIPTION:  After informed consent was obtained, appropriate left hip was marked.  He was brought to the operating room.  General anesthesia was obtained.  Traction boots were placed on both his feet. He was placed supine on the Hana fracture table.  First, I assisted his hip under direct fluoroscopy, after general anesthesia was obtained and a time-out was called and he was identified as correct patient, correct left hip and again it looked like it was more of an oddly enough posterior dislocation.  I was not able to close, reduce it.  We then prepped his left hip with DuraPrep and sterile drapes and I went through his previous incision and went down to the tensor fascia lata which we divided longitudinally, all of his previous incision.  At that point, it did appear that the hip was  dislocated, but more posterior.  With that being said, I decided to take the 36+ 1.5 metal hip ball off and we trialed a 36+ 5 reducing the pelvis and it was incredibly tight, increased his leg length and had very hard time even dislocating the trial.  We dislocated the trial, then I placed the real 36+ 5 metal hip ball and reducing the pelvis and again up into the extremes of internal and external rotation and he had no shuck and I still was able to visualize the cup prior to reducing it to make sure that the polyethylene liner was intact and it certainly was and I was still pleased with the version of the cup not being closed down or retroverted.  We then reduced the real hip and again it was stable.  We irrigated the soft tissue with normal saline solution.  I was  able to close the joint capsule with interrupted #1 Ethibond suture followed by running #1 Vicryl in the tensor fascia, 0 Vicryl in the deep tissue, 2-0 Vicryl in subcutaneous tissue, and interrupted staples on the skin.  We then placed well padded sterile dressing.  He was taken off the operating table and put a knee immobilizer after he was awakened, extubated, and taken to the recovery room in stable condition.  All final counts were correct.  There were no complications noted.     Randall Hoffman, M.D.     CYB/MEDQ  D:  10/13/2015  T:  10/14/2015  Job:  [id number]

## 2015-10-15 ENCOUNTER — Telehealth: Payer: Self-pay | Admitting: Lab

## 2015-10-15 DIAGNOSIS — M25552 Pain in left hip: Secondary | ICD-10-CM

## 2015-10-15 DIAGNOSIS — M25562 Pain in left knee: Secondary | ICD-10-CM

## 2015-10-15 DIAGNOSIS — M549 Dorsalgia, unspecified: Secondary | ICD-10-CM

## 2015-10-15 LAB — BASIC METABOLIC PANEL
Anion gap: 7 (ref 5–15)
BUN: 11 mg/dL (ref 6–20)
BUN: 11 mg/dL (ref 4–21)
CHLORIDE: 101 mmol/L (ref 101–111)
CO2: 22 mmol/L (ref 22–32)
Calcium: 8.1 mg/dL — ABNORMAL LOW (ref 8.9–10.3)
Creatinine, Ser: 0.9 mg/dL (ref 0.61–1.24)
Creatinine: 0.9 mg/dL (ref 0.6–1.3)
GFR calc Af Amer: >60 mL/min (ref >60)
GFR calc non Af Amer: >60 mL/min (ref >60)
GLUCOSE: 90 mg/dL
Glucose, Bld: 90 mg/dL (ref 65–99)
POTASSIUM: 3.8 mmol/L (ref 3.5–5.1)
SODIUM: 130 mmol/L — AB (ref 135–145)
Sodium: 130 mmol/L — AB (ref 137–147)

## 2015-10-15 LAB — CBC
HCT: 24.3 % — ABNORMAL LOW (ref 39.0–52.0)
HEMOGLOBIN: 8.3 g/dL — AB (ref 13.0–17.0)
MCH: 31.6 pg (ref 26.0–34.0)
MCHC: 34.2 g/dL (ref 30.0–36.0)
MCV: 92.4 fL (ref 78.0–100.0)
Platelets: 192 10*3/uL (ref 150–400)
RBC: 2.63 MIL/uL — AB (ref 4.22–5.81)
RDW: 13.9 % (ref 11.5–15.5)
WBC: 4.9 10*3/uL (ref 4.0–10.5)

## 2015-10-15 LAB — CBC AND DIFFERENTIAL: WBC: 4.9 10*3/mL

## 2015-10-15 LAB — MAGNESIUM: MAGNESIUM: 1.9 mg/dL (ref 1.7–2.4)

## 2015-10-15 MED ORDER — RIVAROXABAN 10 MG PO TABS
10.0000 mg | ORAL_TABLET | Freq: Every day | ORAL | Status: DC
  Administered 2015-10-15 – 2015-10-16 (×2): 10 mg via ORAL
  Filled 2015-10-15 (×2): qty 1

## 2015-10-15 MED ORDER — HYDROCODONE-ACETAMINOPHEN 5-325 MG PO TABS
1.0000 | ORAL_TABLET | ORAL | Status: DC | PRN
  Administered 2015-10-15: 2 via ORAL
  Administered 2015-10-16 (×2): 1 via ORAL
  Filled 2015-10-15: qty 1
  Filled 2015-10-15: qty 2
  Filled 2015-10-15: qty 1

## 2015-10-15 MED ORDER — RIVAROXABAN 10 MG PO TABS: 10.0000 mg | ORAL_TABLET | Freq: Every day | ORAL | Status: DC

## 2015-10-15 NOTE — Telephone Encounter (Signed)
Randall Hoffman took this call from  ( 10/12/15) Randall Hoffman who is in IM teaching Service-Pt was hospitalized and dx with Hep C.  Spoke with Dr Luciana Axeomer regarding pt and he requested that the pt establishes care with A PCP before we could [facility].  The PCP would then need to send us a referral to confirm Hep C Diagnosis.  Called [phone]-sp[facility] with [facility].

## 2015-10-15 NOTE — NC FL2 (Signed)
[place] MEDICAID FL2 LEVEL OF CARE SCREENING TOOL     IDENTIFICATION  Patient Name: Randall Hoffman Birthdate: 1935/02/10 Sex: male Admission Date (Current Location): 10/11/2015  [facility] and [place] Number:  Producer, television/film/video and Address:  [facility], [address] [place], [place] [address]      Provider Number: [id number]  Attending Physician Name and Address:  Burns Spain, MD  Relative Name and Phone Number:       Current Level of Care: Hospital Recommended Level of Care: Skilled Nursing Facility Prior Approval Number:    Date Approved/Denied:   PASRR Number: [id number]  Discharge Plan: SNF    Current Diagnoses: Patient Active Problem List   Diagnosis Date Noted  . Atherosclerosis of aorta (HCC) 10/12/2015  . Left displaced femoral neck fracture (HCC) 10/11/2015  . Seizure disorder (HCC) 10/11/2015  . Hepatitis C 10/11/2015  . History of stroke in prior 3 months 10/11/2015    Orientation RESPIRATION BLADDER Height & Weight     Self  Normal Incontinent Weight: 138 lb (62.596 kg) Height:     BEHAVIORAL SYMPTOMS/MOOD NEUROLOGICAL BOWEL NUTRITION STATUS   (NONE) Convulsions/Seizures Incontinent Diet (Please see discharge summary.)  AMBULATORY STATUS COMMUNICATION OF NEEDS Skin   Extensive Assist Verbally Surgical wounds                       Personal Care Assistance Level of Assistance  Bathing, Feeding, Dressing Bathing Assistance: Maximum assistance Feeding assistance: Independent Dressing Assistance: Maximum assistance     Functional Limitations Info  Sight, Hearing, Speech Sight Info: Adequate Hearing Info: Adequate Speech Info: Adequate    SPECIAL CARE FACTORS FREQUENCY  PT (By licensed PT), OT (By licensed OT)     PT Frequency: 5 OT Frequency: 5            Contractures      Additional Factors Info  Code Status, Allergies Code Status Info: DNR Allergies Info: Penicillins            Current Medications (10/15/2015):  This is the current hospital active medication list Current Facility-Administered Medications  Medication Dose Route Frequency Provider Last Rate Last Dose  . acetaminophen (TYLENOL) tablet 650 mg  650 mg Oral Q6H PRN Yolanda Manges, DO   650 mg at 10/14/15 2017   Or  . acetaminophen (TYLENOL) suppository 650 mg  650 mg Rectal Q6H PRN Yolanda Manges, DO      . aspirin chewable tablet 81 mg  81 mg Oral Daily Lora Paula, MD   81 mg at 10/15/15 0843  . atorvastatin (LIPITOR) tablet 10 mg  10 mg Oral QHS Yolanda Manges, DO   10 mg at 10/14/15 2019  . citalopram (CELEXA) tablet 10 mg  10 mg Oral Daily Yolanda Manges, DO   10 mg at 10/15/15 0844  . feeding supplement (ENSURE ENLIVE) (ENSURE ENLIVE) liquid 237 mL  237 mL Oral Daily Yolanda Manges, DO   237 mL at 10/14/15 7846  . HYDROcodone-acetaminophen (NORCO/VICODIN) 5-325 MG per tablet 1-2 tablet  1-2 tablet Oral Q4H PRN Lora Paula, MD   2 tablet at 10/15/15 1020  . ketorolac (TORADOL) 15 MG/ML injection 15 mg  15 mg Intravenous Q6H Lora Paula, MD   15 mg at 10/15/15 1234  . menthol-cetylpyridinium (CEPACOL) lozenge 3 mg  1 lozenge Oral PRN Kathryne Hitch, MD       Or  . phenol (  CHLORASEPTIC) mouth spray 1 spray  1 spray Mouth/Throat PRN Kathryne Hitchhristopher Y Blackman, MD      . methocarbamol (ROBAXIN) tablet 500 mg  500 mg Oral Q6H PRN Kathryne Hitchhristopher Y Blackman, MD   500 mg at 10/14/15 2017   Or  . methocarbamol (ROBAXIN) 500 mg in dextrose 5 % 50 mL IVPB  500 mg Intravenous Q6H PRN Kathryne Hitchhristopher Y Blackman, MD      . metoCLOPramide (REGLAN) tablet 5-10 mg  5-10 mg Oral Q8H PRN Kathryne Hitchhristopher Y Blackman, MD       Or  . metoCLOPramide (REGLAN) injection 5-10 mg  5-10 mg Intravenous Q8H PRN Kathryne Hitchhristopher Y Blackman, MD      . ondansetron [facility](ZOFRAN) tablet 4 mg  4 mg Oral Q6H PRN Kathryne Hitchhristopher Y Blackman, MD   4 mg at 10/14/15 [id number]   Or  . ondansetron (ZOFRAN) injection 4 mg  4 mg Intravenous Q6H PRN Kathryne Hitchhristopher  Y Blackman, MD      . rivaroxaban Carlena Hurl(XARELTO) tablet 10 mg  10 mg Oral Q supper Lora PaulaJennifer T Krall, MD      . sodium chloride flush (NS) 0.9 % injection 3 mL  3 mL Intravenous Q12H Yolanda MangesAlex M Wilson, DO   3 mL at 10/14/15 2200  . sodium chloride flush (NS) 0.9 % injection 3 mL  3 mL Intravenous PRN Yolanda MangesAlex M Wilson, DO      . tamsulosin [facility](FLOMAX) capsule 0.4 mg  0.4 mg Oral QHS Yolanda MangesAlex M Wilson, DO   0.4 mg at 10/14/15 2018  . topiramate (TOPAMAX) tablet 50 mg  50 mg Oral BID Yolanda MangesAlex M Wilson, DO   50 mg at 10/15/15 [id number]  . zolpidem (AMBIEN) tablet 5 mg  5 mg Oral QHS PRN Kathryne Hitchhristopher Y Blackman, MD   5 mg at 10/14/15 2019     Discharge Medications: Please see discharge summary for a list of discharge medications.  Relevant Imaging Results:  Relevant Lab Results:   Additional Information SSN: [id number]  Rod MaeVaughn, Koda Defrank S, [place]

## 2015-10-15 NOTE — Progress Notes (Signed)
Patient ID: Randall Hoffman, male   DOB: 1934-06-04, 81 y.o.   MRN: [id number] No acute changes.  Leg length appropriate on operative left side.  Denies significant pain.  Does not need the knee immobilizer on, but needs to try to no cross his legs in bed.  I talked to him about this.  His dressing is lean and dry and will stay on for two weeks.  Will follow.

## 2015-10-15 NOTE — Progress Notes (Signed)
Subjective: Randall Hoffman is feeling decently well this morning. He reports pain in his hip, but also mentions that his back and knees are bothering him, and he is back to his baseline. He has continued to tolerate oral fluid intake without nausea or vomiting, but has had small appetite for solid food.   Objective: Vital signs in last 24 hours: Filed Vitals:   10/14/15 0553 10/14/15 1300 10/14/15 2016 10/15/15 0456  BP: 89/44 108/42 107/54 108/58  Pulse: 87 87 90 99  Temp: 97.7 F (36.5 C) 97.1 F (36.2 C) 97.3 F (36.3 C) 99.5 F (37.5 C)  TempSrc: Oral Oral Oral Oral  Resp: 12 16 16 16   Weight:      SpO2: 100% 100% 99% 100%   Weight change:   Intake/Output Summary (Last 24 hours) at 10/15/15 1022 Last data filed at 10/15/15 1011  Gross per 24 hour  Intake    580 ml  Output   1450 ml  Net   -870 ml   Physical Exam:  General: Lying in bed, mildly confused, but conversive HEENT: Moist mucous membranes, oropharynx clear. Sclera anicteric, extraocular movements intact Cardiology: Regular rate and rhythm without murmurs, rubs, or gallops.  Pulmonary: Stable on room air, normal work of breathing Abdomen: Soft, non-tender to palpation, non-distended. No organomegaly or masses.  Extremities: Surgical site on left thigh covered with clean bandage. Pain with passive and active movement of LLE, but legs equal in length and no evidence of hemarthrosis Neuro: Alert & oriented, though irritable Skin: warm and well-perfused  Lab Results: BMP Latest Ref Rng 10/15/2015 10/14/2015 10/13/2015  Glucose 65 - 99 mg/dL 90 865(H) 99  BUN 6 - 20 mg/dL 11 12 19   Creatinine 0.61 - 1.24 mg/dL 8.46 9.62 9.52  Sodium 135 - 145 mmol/L 130(L) 129(L) 131(L)  Potassium 3.5 - 5.1 mmol/L 3.8 3.4(L) 3.6  Chloride 101 - 111 mmol/L 101 99(L) 100(L)  CO2 22 - 32 mmol/L 22 24 22   Calcium 8.9 - 10.3 mg/dL 8.1(L) 7.9(L) 8.2(L)   CBC Latest Ref Rng 10/15/2015 10/14/2015 10/13/2015  WBC 4.0 - 10.5 K/uL 4.9 5.1  6.3  Hemoglobin 13.0 - 17.0 g/dL 8.3(L) 7.9(L) 9.5(L)  Hematocrit 39.0 - 52.0 % 24.3(L) 23.1(L) 28.5(L)  Platelets 150 - 400 K/uL 192 153 176   Recent Labs     10/15/15  0304  MG  1.9    Micro Results: Recent Results (from the past 240 hour(s))  Surgical pcr screen     Status: None   Collection Time: 10/11/15  5:11 PM  Result Value Ref Range Status   MRSA, PCR NEGATIVE NEGATIVE Final   Staphylococcus aureus NEGATIVE NEGATIVE Final    Comment:        The Xpert SA Assay (FDA approved for NASAL specimens in patients over 45 years of age), is one component of a comprehensive surveillance program.  Test performance has been validated by [facility] for patients greater than or equal to 22 year old. It is not intended to diagnose infection nor to guide or monitor treatment.    Studies/Results: Dg Pelvis Portable  10/13/2015  CLINICAL DATA:  Status post surgical reduction of the dislocated left hip prosthesis. EXAM: PORTABLE PELVIS 1-2 VIEWS COMPARISON:  10/11/2015 FINDINGS: On the 2 AP views acquired, the femoral and acetabular prosthetic components are well aligned. There is no acute fracture. There is no evidence of prosthetic loosening. IMPRESSION: Well aligned left hip prosthesis. Electronically Signed   By: Renard Hamper.D.  On: 10/13/2015 14:45   Dg Hip Operative Unilat With Pelvis Left  10/13/2015  CLINICAL DATA:  It s/p surgical reduction of a hip prosthesis dislocation. EXAM: OPERATIVE left HIP (WITH PELVIS IF PERFORMED) 3 VIEWS TECHNIQUE: Fluoroscopic spot image(s) were submitted for interpretation post-operatively. COMPARISON:  10/13/2015 at 9:49 a.m. FINDINGS: The femoral acetabular prosthetic components have been reduced into normal alignment. There is no acute fracture. There is no evidence of prosthetic component loosening. IMPRESSION: Well aligned left hip prosthesis following surgical reduction. Electronically Signed   By: Amie Portlandavid  Ormond M.D.   On: 10/13/2015 14:44     Medications:  Scheduled Meds: . aspirin  81 mg Oral Daily  . atorvastatin  10 mg Oral QHS  . citalopram  10 mg Oral Daily  . enoxaparin (LOVENOX) injection  40 mg Subcutaneous Q24H  . feeding supplement (ENSURE ENLIVE)  237 mL Oral Daily  . ketorolac  15 mg Intravenous Q6H  . sodium chloride flush  3 mL Intravenous Q12H  . tamsulosin  0.4 mg Oral QHS  . topiramate  50 mg Oral BID   Continuous Infusions:  PRN Meds:.acetaminophen **OR** acetaminophen, HYDROcodone-acetaminophen, menthol-cetylpyridinium **OR** phenol, methocarbamol **OR** methocarbamol (ROBAXIN)  IV, metoCLOPramide **OR** metoCLOPramide (REGLAN) injection, ondansetron **OR** ondansetron (ZOFRAN) IV, sodium chloride flush, zolpidem  Assessment/Plan: Randall Hoffman is a 81 yo gentleman with PMH significant for strokes, seizure disorder, dementia, and HepC admitted for acute subcapital fracture of the left femoral neck. He is now POD4 from a total hip arthroplasty and POD2 from dislocation revision.   Left subcapital femoral fracture: s/p total hip arthroplasty and subsequent revision for dislocation. He is tolerating oral food and pain medication well, and making good urine output with oral hydration. From a medical standpoint, he is ready for discharge, and we will transition his toradol to oral NSAIDs today.  -Ortho following, we appreciate reccommendations  -PO Tylenol, Norco PRN, Robaxin,  -Full diet -Transition Lovenox to Xarelto -PT/OT following -Consider bisphosphonate therapy and vitamin D/Calcuium supplementation following discharge  History of stroke: Unclear history. Risk factors include former smoking and age. He was hospitalized for several weeks with a few "losses of consciousness." He is on daily Aspirin for secondary prophylaxis.  -Resume home Aspirin 81 mg  Seizure disorder: Reportedly well-controlled without having any seizure activity in many years. Recently switched from Phenobarbitol to topamax for  anti-epileptic control, without issue. Facility staff and EMS do not report any clear post-ictal symptoms after his fall last night, but the patient is unable to recall everything that occurred other than noting fecal incontinence. He does have episodes of both incontinence and confusion at baseline.  -Continue home topamax 50 BID  Hepatitis C: Untreated 2/2 financial constraints. Hepatic function panel shows mildly elevated AST of 53 and ALT of 42. Total bilirubin 1.3 (direct 0.4), INR 1.34.Reportedly previous problem with Medicare coverage of Harvoni.  -Follow-up with ID clinic outpatient  Diet: Full diet -Zofran, reglan PRN for nausea  Prophylaxis: Lovenox [place] transitioning to Xarelto PO  Dispo: Disposition is deferred at this time, awaiting improvement of current medical problems. Anticipated discharge in approximately 1 day(s).   The patient does not have a current PCP here in [place] (No primary care provider on file.) and [facility].  The patient does not have transportation limitations that hinder transportation to clinic appointments.  This is a Psychologist, occupationalMedical Student Note.  The care of the patient was discussed with Dr. Rogelia BogaButcher and the assessment and plan formulated with  their assistance.  Please see their attached note for official documentation of the daily encounter.   LOS: 4 days   Jonell Cluck, Med Student 10/15/2015, 10:22 AM

## 2015-10-15 NOTE — Clinical Social Work Note (Signed)
LCSW met with patient, patient's wife, and patient's daughter at bedside to discuss discharge disposition. LCSW actively seeking SNF placement at this time. Full assessment to follow.  Lubertha Sayres, [facility]: [phone] Surgical: [phone]

## 2015-10-15 NOTE — Progress Notes (Signed)
  Date: 10/15/2015  Patient name: Randall Hoffman  Medical record number: [id number]  Date of birth: 1934/11/18   This patient has been seen and the plan of care was discussed with the house staff. Please see their note for complete details. I concur with their findings with the following additions/corrections: Mr Gus Rankinngarola was seen on AM rounds. He is lying in bed. He is easily frustrated by questions. He c/o L hip, knee, and back pain. He is now medically stable and may be transferred to SNF once bed available. DOAC for DVT prophylaxis.  Burns SpainElizabeth A Alicia Ackert, MD 10/15/2015, 2:26 PM

## 2015-10-15 NOTE — Progress Notes (Signed)
Subjective: No new complaints this morning. Some discomfort in his leg and back. Tolerating PO without N/V. Voiding without difficulty.  Objective: Vital signs in last 24 hours: Filed Vitals:   10/14/15 0553 10/14/15 1300 10/14/15 2016 10/15/15 0456  BP: 89/44 108/42 107/54 108/58  Pulse: 87 87 90 99  Temp: 97.7 F (36.5 C) 97.1 F (36.2 C) 97.3 F (36.3 C) 99.5 F (37.5 C)  TempSrc: Oral Oral Oral Oral  Resp: Weight:      SpO2: 100% 100% 99% 100%   Weight change:   Intake/Output Summary (Last 24 hours) at 10/15/15 1030 Last data filed at 10/15/15 1011  Gross per 24 hour  Intake    580 ml  Output   1450 ml  Net   -870 ml   General Apperance: NAD HEENT: Normocephalic, atraumatic, anicteric sclera Neck: Supple, trachea midline Lungs: Clear to auscultation bilaterally. No wheezes, rhonchi or rales. Breathing comfortably Heart: Regular rate and rhythm, no murmur/rub/gallop Abdomen: Soft, nontender, nondistended, no rebound/guarding Extremities: Warm and well perfused, no edema. Dressing in place. Clean/dry/intact. No surrounding erythema, warmth or drainage.  Skin: No rashes or lesions Neurologic: Alert and interactive. No gross deficits.  Lab Results: Basic Metabolic Panel:  Recent Labs Lab 10/14/15 0427 10/15/15 0304  NA 129* 130*  K 3.4* 3.8  CL 99* 101  CO2 24 22  GLUCOSE 110* 90  BUN 12 11  CREATININE 0.97 0.90  CALCIUM 7.9* 8.1*  MG  --  1.9   Liver Function Tests:  Recent Labs Lab 10/11/15 0950  AST 53*  ALT 42  ALKPHOS 76  BILITOT 1.3*  PROT 6.7  ALBUMIN 3.1*   CBC:  Recent Labs Lab 10/11/15 0541  10/14/15 0427 10/15/15 0304  WBC 5.3  < > 5.1 4.9  NEUTROABS 2.0  --   --   --   HGB 12.9*  < > 7.9* 8.3*  HCT 37.6*  < > 23.1* 24.3*  MCV 94.2  < > 93.1 92.4  PLT 150  < > 153 192  < > = values in this interval not displayed. Coagulation:  Recent Labs Lab 10/11/15 0950  LABPROT 16.7*  INR 1.34   Urine Drug  Screen: Drugs of Abuse     Component Value Date/Time   LABOPIA POSITIVE* 10/12/2015 0153   COCAINSCRNUR NONE DETECTED 10/12/2015 0153   LABBENZ NONE DETECTED 10/12/2015 0153   AMPHETMU NONE DETECTED 10/12/2015 0153   THCU NONE DETECTED 10/12/2015 0153   LABBARB POSITIVE* 10/12/2015 0153    Urinalysis:  Recent Labs Lab 10/12/15 0153  COLORURINE AMBER*  LABSPEC 1.022  PHURINE 6.0  GLUCOSEU NEGATIVE  HGBUR NEGATIVE  BILIRUBINUR NEGATIVE  KETONESUR 15*  PROTEINUR NEGATIVE  NITRITE NEGATIVE  LEUKOCYTESUR SMALL*    Studies/Results: Dg Pelvis Portable  10/13/2015  CLINICAL DATA:  Status post surgical reduction of the dislocated left hip prosthesis. EXAM: PORTABLE PELVIS 1-2 VIEWS COMPARISON:  10/11/2015 FINDINGS: On the 2 AP views acquired, the femoral and acetabular prosthetic components are well aligned. There is no acute fracture. There is no evidence of prosthetic loosening. IMPRESSION: Well aligned left hip prosthesis. Electronically Signed   By: Amie Portland M.D.   On: 10/13/2015 14:45   Dg Hip Operative Unilat With Pelvis Left  10/13/2015  CLINICAL DATA:  It s/p surgical reduction of a hip prosthesis dislocation. EXAM: OPERATIVE left HIP (WITH PELVIS IF PERFORMED) 3 VIEWS TECHNIQUE: Fluoroscopic spot image(s) were submitted for interpretation post-operatively. COMPARISON:  10/13/2015 at  9:49 a.m. FINDINGS: The femoral acetabular prosthetic components have been reduced into normal alignment. There is no acute fracture. There is no evidence of prosthetic component loosening. IMPRESSION: Well aligned left hip prosthesis following surgical reduction. Electronically Signed   By: Amie Portlandavid  Ormond M.D.   On: 10/13/2015 14:44   Medications: I have reviewed the patient's current medications. Scheduled Meds: . aspirin  81 mg Oral Daily  . atorvastatin  10 mg Oral QHS  . citalopram  10 mg Oral Daily  . enoxaparin (LOVENOX) injection  40 mg Subcutaneous Q24H  . feeding supplement (ENSURE  ENLIVE)  237 mL Oral Daily  . ketorolac  15 mg Intravenous Q6H  . sodium chloride flush  3 mL Intravenous Q12H  . tamsulosin  0.4 mg Oral QHS  . topiramate  50 mg Oral BID   Continuous Infusions:   PRN Meds:.acetaminophen **OR** acetaminophen, HYDROcodone-acetaminophen, menthol-cetylpyridinium **OR** phenol, methocarbamol **OR** methocarbamol (ROBAXIN)  IV, metoCLOPramide **OR** metoCLOPramide (REGLAN) injection, ondansetron **OR** ondansetron (ZOFRAN) IV, sodium chloride flush, zolpidem Assessment/Plan: 81 year old man with hx of CVA, seizure disorder, dementia and HCV (reportedly w/ cirrhosis) here from SNF with hip pain s/p fall from bed last night.  Left displaced femoral neck fracture, osteoporosis: No seizure activity noted and patient compliant with AED.Underwent left total hip arthroplasty on 5/18. Found to have displaced femoral head prosthesis on 5/20. Proceeded to OR for revision. - PT/OT recommending SNF. - Norco 5/325 1-2 tab q4hr prn - Toradol 15mg  q6hr - Given this is a low trauma fracture, he is considered to have osteoporosis and would qualify for bisphosphonate on discharge. Will plan on discharging on 70 mg once weekly of alendronate.  Urinary retention: Resolved - Continue home Flomax 0.4 mg QHS  Hx of seizure: Continue home Topamax  HCV possibly with cirrhosis: Patient reports hx of HCV and says insurance would not cover Harvoni treatment. He thinks he may have cirrhosis. Total bilirubin 1.3, INR 1.34.  - outpatient follow-up with [facility]/hep clinic  Hx of CVA: Continue home ASA daily, Lipitor daily.  Diet: Regular VTE ppx: Xarelto 10mg  daily for total 35 days post op. Code status: DNR  Dispo: Awaiting SNF  The patient does not have a current PCP (No primary care provider on file.) and does not [facility] appointment after discharge.  The patient does not have transportation limitations that hinder transportation to clinic  appointments.  .Services Needed at time of discharge: Y = Yes, Blank = No PT:   OT:   RN:   Equipment:   Other:     LOS: 4 days   Lora PaulaJennifer T Jamyrah Saur, MD 10/15/2015, 10:30 AM

## 2015-10-16 ENCOUNTER — Encounter (HOSPITAL_COMMUNITY): Payer: Self-pay | Admitting: Orthopaedic Surgery

## 2015-10-16 DIAGNOSIS — M419 Scoliosis, unspecified: Secondary | ICD-10-CM

## 2015-10-16 NOTE — Clinical Social Work Note (Signed)
Clinical Social Work Assessment  Patient Details  Name: Randall Hoffman MRN: [id number] Date of Birth: [birth year]  Date of referral:  10/15/15               Reason for consult:  Facility Placement                Permission sought to share information with:  Family Supports, Customer service manager Permission granted to share information::  Yes, Verbal Permission Granted  Name::     Lourena Simmonds  Agency::  [facility] SNF  Relationship::  Daughter  Contact Information:  [phone]  Housing/Transportation Living arrangements for the past 2 months:  [place] of Information:  Patient, Adult Children Patient Interpreter Needed:  None Criminal Activity/Legal Involvement Pertinent to Current Situation/Hospitalization:  No - Comment as needed Significant Relationships:  Adult Children, Spouse Lives with:  Facility Resident Do you feel safe going back to the place where you live?  Yes Need for family participation in patient care:  Yes (Comment) (Patient's daughter active in patient's care.)  Care giving concerns:  Patient nor patient's family expressed concerns at this time.   Social Worker assessment / plan:  LCSW received referral for possible SNF placement at time of discharge. LCSW met with patient, patient's wife, and patient's daughter at bedside to discuss discharge planning needs. Per patient and patient's family, everyone in agreement with patient discharging to SNF at time of discharge. LCSW to continue to follow and assist with discharge planning needs.  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  Patient/Family's Understanding of and  Emotional Response to Diagnosis, Current Treatment, and Prognosis:  Patient and patient's family understanding and agreeable to LCSW plan of care.  Emotional Assessment Appearance:  Appears stated age Attitude/Demeanor/Rapport:  Other (Appropriate) Affect (typically observed):  Accepting, Appropriate, Pleasant Orientation:  Oriented to Self, Oriented to Place, Oriented to  Time, Oriented to Situation Alcohol / Substance use:  Not Applicable Psych involvement (Current and /or in the community):  No (Comment) (Not appropriate on this admission.)  Discharge Needs  Concerns to be addressed:  No discharge needs identified Readmission within the last 30 days:  No Current discharge risk:  None Barriers to Discharge:  No Barriers Identified   Caroline Sauger, LCSW 10/16/2015, 1:27 PM [phone]

## 2015-10-16 NOTE — Care Management Important Message (Signed)
Important Message  Patient Details  Name: Randall Hoffman MRN: [id number] Date of Birth: [birth year]   Medicare Important Message Given:  Yes    Keyonta Madrid, Stephan MinisterSusan Coleman 10/16/2015, 2:15 PM

## 2015-10-16 NOTE — Progress Notes (Signed)
PT Cancellation Note  Patient Details Name: Randall Hoffman MRN: [id number] DOB: 1934-07-08   Cancelled Treatment:    Reason Eval/Treat Not Completed: Patient refusing to participate with PT at any level. Pt with increasing agitation as encouragement provided to participate. Will continue to follow as able.    Christiane HaBenjamin J. Konstantin Lehnen, PT, CSCS Pager [phone] Office [phone]  10/16/2015, 11:20 AM

## 2015-10-16 NOTE — Progress Notes (Signed)
PT Cancellation Note  Patient Details Name: Randall Hoffman MRN: [id number] DOB: 1935/04/01   Cancelled Treatment:    Reason Eval/Treat Not Completed: Patient refused second attempt to participate with PT. Pt not giving any explanation just telling therapist to "go away". At this time the patient continues to refuse all PT services.    Christiane HaBenjamin J. Hillery Bhalla, PT, CSCS Pager [phone] Office [phone]  10/16/2015, 4:09 PM

## 2015-10-16 NOTE — Progress Notes (Signed)
  Date: 10/16/2015  Patient name: Randall Hoffman  Medical record number: [id number]  Date of birth: 01-03-1935   This patient has been seen and the plan of care was discussed with the house staff. Please see their note for complete details. I concur with their findings with the following additions/corrections: Mr Randall Hoffman was seen on AM rounds. He c/p hip pain but states the pain meds do help a bit. He is medically stable for D/C once SNF bed found.  Burns SpainElizabeth A Butcher, MD 10/16/2015, 11:34 AM

## 2015-10-16 NOTE — Clinical Social Work Placement (Signed)
   CLINICAL SOCIAL WORK PLACEMENT  NOTE  Date:  10/16/2015  Patient Details  Name: Randall Hoffman MRN: [id number] Date of Birth: 10/08/1934  Clinical Social Work is seeking post-discharge placement for this patient at the Skilled  Nursing Facility level of care (*CSW will initial, date and re-position this form in  chart as items are completed):  Yes   Patient/family provided with [facility] Clinical Social Work Department's list of facilities offering this level of care within the geographic area requested by the patient (or if unable, by the patient's family).  Yes   Patient/family informed of their freedom to choose among providers that offer the needed level of care, that participate in Medicare, Medicaid or managed care program needed by the patient, have an available bed and are willing to accept the patient.  Yes   Patient/family informed of [place] [facility]'s ownership interest in [facility] and [facility], as well as of the fact that they are under no obligation to receive care at these facilities.  PASRR submitted to EDS on 10/16/15     PASRR number received on 10/16/15     Existing PASRR number confirmed on       FL2 transmitted to all facilities in geographic area requested by pt/family on 10/16/15     FL2 transmitted to all facilities within larger geographic area on       Patient informed that his/her managed care company has contracts with or will negotiate with certain facilities, including the following:        Yes   Patient/family informed of bed offers received.  [facility]     Physician recommends and patient chooses bed at      Patient to be transferred to   on  .  Patient to be transferred to facility by       Patient family notified on   of transfer.  Name of family member notified:        PHYSICIAN       Additional Comment:    _______________________________________________ Rod MaeVaughn, Yong Wahlquist S, LCSW 10/16/2015, 1:30  PM

## 2015-10-16 NOTE — Progress Notes (Signed)
Subjective: No acute events overnight.  [name] was seen and examined this AM.  Doing ok.  Eating.  Pain relief when he gets po meds.  Ready for d/c.  Objective: Vital signs in last 24 hours: Filed Vitals:   10/15/15 0456 10/15/15 1300 10/15/15 2025 10/16/15 0501  BP: 108/58 107/58 108/56 98/54  Pulse: 99 89 95 93  Temp: 99.5 F (37.5 C) 98.3 F (36.8 C) 97.5 F (36.4 C) 97.7 F (36.5 C)  TempSrc: Oral  Oral Oral  Resp: Weight:      SpO2: 100% 100% 100% 99%   Weight change:   Intake/Output Summary (Last 24 hours) at 10/16/15 1026 Last data filed at 10/16/15 0501  Gross per 24 hour  Intake    120 ml  Output    800 ml  Net   -680 ml   General: resting in bed in NAD HEENT: [place]/AT Cardiac: RRR, no rubs, murmurs or gallops Pulm: clear to auscultation bilaterally, moving normal volumes of air Abd: soft, nontender, nondistended, BS present Ext: warm and well perfused, no pedal edema, left hip surgical site covered with bandage, no edema or erythema, appropriately TTP Neuro: alert and oriented X3, responding appropriately, following commands, wiggles toes  Lab Results: Medications: I have reviewed the patient's current medications. Scheduled Meds: . aspirin  81 mg Oral Daily  . atorvastatin  10 mg Oral QHS  . citalopram  10 mg Oral Daily  . feeding supplement (ENSURE ENLIVE)  237 mL Oral Daily  . ketorolac  15 mg Intravenous Q6H  . rivaroxaban  10 mg Oral Q supper  . sodium chloride flush  3 mL Intravenous Q12H  . tamsulosin  0.4 mg Oral QHS  . topiramate  50 mg Oral BID   Continuous Infusions: none   PRN Meds:.acetaminophen **OR** acetaminophen, HYDROcodone-acetaminophen, menthol-cetylpyridinium **OR** phenol, methocarbamol **OR** methocarbamol (ROBAXIN)  IV, metoCLOPramide **OR** metoCLOPramide (REGLAN) injection, ondansetron **OR** ondansetron (ZOFRAN) IV, sodium chloride flush, zolpidem   Assessment/Plan: 81 year old man with hx of CVA, seizure  disorder, dementia and HCV (reportedly w/ cirrhosis) here from SNF with hip pain s/p fall from bed.  Left displaced femoral neck fracture, osteoporosis: s/p left total hip arthroplasty on 5/18. Found to have displaced femoral head prosthesis and went to OR 05/20. - medically stable for d/c to SNF for rehab - continue Norco 5/325 1-2 tab q4hr prn - Given this is a low trauma fracture, he is considered to have osteoporosis and would qualify for bisphosphonate on discharge. Will plan on discharging on 70 mg once weekly of alendronate.   Urinary retention: Resolved - Continue home Flomax 0.4 mg QHS  Hx of seizure: Continue home Topamax  HCV possibly with cirrhosis: Patient reports hx of HCV and says insurance would not cover Harvoni treatment. He thinks he may have cirrhosis. Total bilirubin 1.3, INR 1.34.  - outpatient follow-up with [facility]/hep clinic  Hx of CVA: Continue home ASA daily, Lipitor daily.  Diet: Regular VTE ppx: Xarelto  daily for total 35 days post op. Code status: DNR  Dispo: stable for d/c to SNF today when bed available.  The patient does not have a current PCP (No primary care provider on file.) and does not need an [facility] hospital follow-up appointment after discharge.  The patient does not have transportation limitations that hinder transportation to clinic appointments.  .Services Needed at time of discharge: Y = Yes, Blank = No PT:   OT:   RN:   Equipment:  Other:     LOS: 5 days   Yolanda MangesAlex M Paylin Hailu, DO 10/16/2015, 10:26 AM

## 2015-10-16 NOTE — Progress Notes (Signed)
Subjective: [name] is feeling well this morning. He still reports pain in his hip, but reports that it is helped by the oral medications. He continues to have good oral intake and states that he is ready to go to a rehab facility.   Objective: Vital signs in last 24 hours: Filed Vitals:   10/15/15 0456 10/15/15 1300 10/15/15 2025 10/16/15 0501  BP: 108/58 107/58 108/56 98/54  Pulse: 99 89 95 93  Temp: 99.5 F (37.5 C) 98.3 F (36.8 C) 97.5 F (36.4 C) 97.7 F (36.5 C)  TempSrc: Oral  Oral Oral  Resp: 16 18 16 16   Weight:      SpO2: 100% 100% 100% 99%   Weight change:   Intake/Output Summary (Last 24 hours) at 10/16/15 0932 Last data filed at 10/16/15 0501  Gross per 24 hour  Intake    220 ml  Output    800 ml  Net   -580 ml   Physical Exam:  General: Lying in bed, mildly confused, but conversive HEENT: Moist mucous membranes, oropharynx clear. Sclera anicteric, extraocular movements intact Cardiology: Regular rate and rhythm. Soft II/VI systolic murmur heard best at LUSB. No rubs, or gallops.  Pulmonary: Stable on room air, normal work of breathing Abdomen: Soft, non-tender to palpation, non-distended. No organomegaly or masses.  Extremities: Surgical site on left thigh covered with clean bandage. Pain with passive and active movement of LLE, but legs equal in length and no evidence of hemarthrosis Neuro: Alert & oriented, though irritable Skin: warm and well-perfused  Interval Lab Results: No new labs  Micro Results: Recent Results (from the past 240 hour(s))  Surgical pcr screen     Status: None   Collection Time: 10/11/15  5:11 PM  Result Value Ref Range Status   MRSA, PCR NEGATIVE NEGATIVE Final   Staphylococcus aureus NEGATIVE NEGATIVE Final    Comment:        The Xpert SA Assay (FDA approved for NASAL specimens in patients over 79 years of age), is one component of a comprehensive surveillance program.  Test performance has been validated by  [facility] for patients greater than or equal to 724 year old. It is not intended to diagnose infection nor to guide or monitor treatment.    Studies/Results: No results found. Medications:  Scheduled Meds: . aspirin  81 mg Oral Daily  . atorvastatin  10 mg Oral QHS  . citalopram  10 mg Oral Daily  . feeding supplement (ENSURE ENLIVE)  237 mL Oral Daily  . ketorolac  15 mg Intravenous Q6H  . rivaroxaban  10 mg Oral Q supper  . sodium chloride flush  3 mL Intravenous Q12H  . tamsulosin  0.4 mg Oral QHS  . topiramate  50 mg Oral BID   Continuous Infusions:  PRN Meds:.acetaminophen **OR** acetaminophen, HYDROcodone-acetaminophen, menthol-cetylpyridinium **OR** phenol, methocarbamol **OR** methocarbamol (ROBAXIN)  IV, metoCLOPramide **OR** metoCLOPramide (REGLAN) injection, ondansetron **OR** ondansetron (ZOFRAN) IV, sodium chloride flush, zolpidem  Assessment/Plan: [name] is a 81 yo gentleman with PMH significant for strokes, seizure disorder, dementia, and HepC admitted for acute subcapital fracture of the left femoral neck. He is now POD4 from a total hip arthroplasty and POD2 from dislocation revision.   Left subcapital femoral fracture: s/p total hip arthroplasty and subsequent revision for dislocation. He is tolerating oral food and pain medication well, and making good urine output with oral hydration. From a medical standpoint, he is tolerating oral intake, stable on oral pain control and ready for  discharge.  -PO Tylenol, Norco PRN, Robaxin,  -Full diet -Transition Lovenox to Xarelto -PT/OT following -Consider bisphosphonate therapy and vitamin D/Calcuium supplementation following discharge  History of stroke: Unclear history. Risk factors include former smoking and age. He was hospitalized for several weeks with a few "losses of consciousness." He is on daily Aspirin for secondary prophylaxis.  -Resume home Aspirin 81 mg  Seizure disorder: Reportedly  well-controlled without having any seizure activity in many years. Recently switched from Phenobarbitol to topamax for anti-epileptic control, without issue. Facility staff and EMS do not report any clear post-ictal symptoms after his fall last night, but the patient is unable to recall everything that occurred other than noting fecal incontinence. He does have episodes of both incontinence and confusion at baseline.  -Continue home topamax 50 BID  Hepatitis C: Untreated 2/2 financial constraints. Hepatic function panel shows mildly elevated AST of 53 and ALT of 42. Total bilirubin 1.3 (direct 0.4), INR 1.34.Reportedly previous problem with Medicare coverage of Harvoni.  -Follow-up with ID clinic outpatient  Diet: Full diet -Zofran, reglan PRN for nausea  Prophylaxis: Xarelto PO  Dispo: Disposition is deferred at this time, awaiting improvement of current medical problems. Anticipated discharge is today.   The patient does not have a current PCP here in [place] (No primary care provider on file.) and does need an [facility] hospital follow-up appointment after discharge.  The patient does not have transportation limitations that hinder transportation to clinic appointments.  This is a Psychologist, occupational Note.  The care of the patient was discussed with Dr. Rogelia Boga and the assessment and plan formulated with their assistance.  Please see their attached note for official documentation of the daily encounter.   LOS: 5 days   Jonell Cluck, Med Student 10/16/2015, 9:32 AM

## 2015-10-16 NOTE — Progress Notes (Signed)
Patient ID: Randall IrishDonald Hoffman, male   DOB: March 08, 1935, 81 y.o.   MRN: [id number] No acute changes.  Left hip stable.

## 2015-10-16 NOTE — Progress Notes (Signed)
As Engineer, drillingWOT RN, I assisted primary RN with passing meds.

## 2015-10-16 NOTE — Progress Notes (Signed)
[facility]

## 2015-10-17 ENCOUNTER — Non-Acute Institutional Stay (SKILLED_NURSING_FACILITY): Payer: Medicare Other | Admitting: Internal Medicine

## 2015-10-17 ENCOUNTER — Encounter: Payer: Self-pay | Admitting: Internal Medicine

## 2015-10-17 DIAGNOSIS — E46 Unspecified protein-calorie malnutrition: Secondary | ICD-10-CM

## 2015-10-17 DIAGNOSIS — R300 Dysuria: Secondary | ICD-10-CM | POA: Diagnosis not present

## 2015-10-17 DIAGNOSIS — Z8673 Personal history of transient ischemic attack (TIA), and cerebral infarction without residual deficits: Secondary | ICD-10-CM | POA: Diagnosis not present

## 2015-10-17 DIAGNOSIS — S72002S Fracture of unspecified part of neck of left femur, sequela: Secondary | ICD-10-CM | POA: Diagnosis not present

## 2015-10-17 DIAGNOSIS — F329 Major depressive disorder, single episode, unspecified: Secondary | ICD-10-CM | POA: Diagnosis not present

## 2015-10-17 DIAGNOSIS — D62 Acute posthemorrhagic anemia: Secondary | ICD-10-CM

## 2015-10-17 DIAGNOSIS — E871 Hypo-osmolality and hyponatremia: Secondary | ICD-10-CM

## 2015-10-17 DIAGNOSIS — M810 Age-related osteoporosis without current pathological fracture: Secondary | ICD-10-CM

## 2015-10-17 DIAGNOSIS — R5381 Other malaise: Secondary | ICD-10-CM

## 2015-10-17 DIAGNOSIS — G40909 Epilepsy, unspecified, not intractable, without status epilepticus: Secondary | ICD-10-CM

## 2015-10-17 DIAGNOSIS — F32A Depression, unspecified: Secondary | ICD-10-CM

## 2015-10-17 NOTE — Progress Notes (Signed)
LOCATION: [name]  PCP: No primary care provider on file.   Code Status: DNR  Goals of care: Advanced Directive information Advanced Directives 10/17/2015  Does patient have an advance directive? Yes  Type of Advance Directive Out of facility DNR (pink MOST or yellow form)  Does patient want to make changes to advanced directive? No - Patient declined  Copy of advanced directive(s) in chart? Yes       Extended Emergency Contact Information Primary Emergency Contact: No,Contact  [place] Home Phone: [phone] Relation: None   Allergies  Allergen Reactions  . Penicillins Rash    Note patient had ancef on 10/11/15 with no reported complications    Chief Complaint  Patient presents with  . New Admit To SNF    New Admission     HPI:  Patient is a 81 y.o. male seen today for short term rehabilitation post hospital admission from 10/11/15-10/16/15 post fall with left displaced femoral neck fracture. He underwent surgical repair with total hip arthroplasty on 10/11/15. He has history of hep c and seizure disorder among others. He is seen in his room today. He is pleasantly confused. Limited HPI and ROS.   Review of Systems:  Constitutional: Negative for fever HENT: Negative for headache, congestion, nasal discharge, sore throat, difficulty swallowing.   Eyes: Negative for blurred vision  Respiratory: Negative for cough, shortness of breath and wheezing.   Cardiovascular: Negative for chest pain, palpitations, leg swelling.  Gastrointestinal: Negative for heartburn, nausea, vomiting, abdominal pain. Positive for poor appetite. does not remember his last bowel movement GU: has discomfort with urination Musculoskeletal: Negative for fall in the facility.  Neurological: Negative for dizziness.    Past Medical History  Diagnosis Date  . Stroke (HCC)   . Seizure disorder (HCC)   . Hepatitis C    Past Surgical History  Procedure Laterality Date    . Total hip arthroplasty Left 10/11/2015    Procedure: TOTAL HIP ARTHROPLASTY ANTERIOR APPROACH;  Surgeon: Randall Hitch, MD;  Location: [facility] OR;  Service: Orthopedics;  Laterality: Left;  . Total hip revision Left 10/13/2015    Procedure:   REVISION OF LEFT HIP BALL;  Surgeon: Randall Hitch, MD;  Location: [facility] OR;  Service: Orthopedics;  Laterality: Left;   Social History:   reports that he has quit smoking. He does not have any smokeless tobacco history on file. He reports that he does not drink alcohol or use illicit drugs.  Family History  Problem Relation Age of Onset  . Heart disease Other     unknown, patient says family members died long ago and he cannot remember    Medications:   Medication List       This list is accurate as of: 10/17/15  2:03 PM.  Always use your most recent med list.               acetaminophen 500 MG tablet  Commonly known as:  TYLENOL  Take 1,000 mg by mouth 2 (two) times daily.     alendronate 70 MG tablet  Commonly known as:  FOSAMAX  Take 1 tablet (70 mg total) by mouth once a week. Take with a full glass of water on an empty stomach.     aspirin 81 MG chewable tablet  Chew 81 mg by mouth daily.     atorvastatin 10 MG tablet  Commonly known as:  LIPITOR  Take 10 mg by mouth daily.  citalopram 10 MG tablet  Commonly known as:  CELEXA  Take 10 mg by mouth daily.     HYDROcodone-acetaminophen 5-325 MG tablet  Commonly known as:  NORCO/VICODIN  Take 1-2 tablets by mouth every 6 (six) hours as needed for moderate pain.     methocarbamol 500 MG tablet  Commonly known as:  ROBAXIN  Take 1 tablet (500 mg total) by mouth every 6 (six) hours as needed for muscle spasms.     rivaroxaban 10 MG Tabs tablet  Commonly known as:  XARELTO  Take 1 tablet (10 mg total) by mouth daily with supper. Stop after November 17, 2015     tamsulosin 0.4 MG Caps capsule  Commonly known as:  FLOMAX  Take 0.4 mg by mouth at bedtime.      topiramate 50 MG tablet  Commonly known as:  TOPAMAX  Take 50 mg by mouth 2 (two) times daily.     UNABLE TO FIND  Med Name: Med pass 120 mL by mouth daily        Immunizations:  There is no immunization history on file for this patient.   Physical Exam: Filed Vitals:   10/17/15 1356  BP: 107/77  Pulse: 99  Temp: 97.8 F (36.6 C)  TempSrc: Oral  Resp: 19  Weight: 138 lb (62.596 kg)  SpO2: 97%    General- elderly frail male, in no acute distress Head- normocephalic, atraumatic Nose- no maxillary or frontal sinus tenderness, no nasal discharge Throat- moist mucus membrane, poor dentition Eyes- PERRLA, EOMI, no pallor, no icterus, no discharge, normal conjunctiva, normal sclera Neck- no cervical lymphadenopathy Cardiovascular- normal s1,s2, no murmur, trace left leg edema Respiratory- bilateral clear to auscultation, no wheeze, no rhonchi, no crackles, no use of accessory muscles Abdomen- bowel sounds present, soft, non tender Musculoskeletal- able to move all 4 extremities, limited left hip range of motion Neurological- alert and oriented to person and place Skin- warm and dry, left hip surgical incision with aquacel dressing Psychiatry- somewhat flat affect    Labs reviewed: Basic Metabolic Panel:  Recent Labs  81/19/14 0544 10/14/15 0427 10/15/15 10/15/15 0304  NA 131* 129* 130* 130*  K 3.6 3.4*  --  3.8  CL 100* 99*  --  101  CO2 22 24  --  22  GLUCOSE 99 110*  --  90  BUN 19 12 11 11   CREATININE 1.19 0.97 0.9 0.90  CALCIUM 8.2* 7.9*  --  8.1*  MG  --   --   --  1.9   Liver Function Tests:  Recent Labs  10/11/15 0950  AST 53*  ALT 42  ALKPHOS 76  BILITOT 1.3*  PROT 6.7  ALBUMIN 3.1*   No results for input(s): LIPASE, AMYLASE in the last 8760 hours. No results for input(s): AMMONIA in the last 8760 hours. CBC:  Recent Labs  10/11/15 0541  10/13/15 0544 10/14/15 0427 10/15/15 10/15/15 0304  WBC 5.3  < > 6.3 5.1 4.9 4.9  NEUTROABS 2.0   --   --   --   --   --   HGB 12.9*  < > 9.5* 7.9*  --  8.3*  HCT 37.6*  < > 28.5* 23.1*  --  24.3*  MCV 94.2  < > 94.1 93.1  --  92.4  PLT 150  < > 176 153  --  192  < > = values in this interval not displayed. Cardiac Enzymes: No results for input(s): CKTOTAL, CKMB, CKMBINDEX, TROPONINI in  the last 8760 hours. BNP: Invalid input(s): POCBNP CBG: No results for input(s): GLUCAP in the last 8760 hours.  Radiological Exams: Ct Head Wo Contrast  10/11/2015  CLINICAL DATA:  Patient fell out of bed. Now with head and neck pain. EXAM: CT HEAD WITHOUT CONTRAST CT CERVICAL SPINE WITHOUT CONTRAST TECHNIQUE: Multidetector CT imaging of the head and cervical spine was performed following the standard protocol without intravenous contrast. Multiplanar CT image reconstructions of the cervical spine were also generated. COMPARISON:  None. FINDINGS: CT HEAD FINDINGS Diffuse cerebral atrophy. Ventricular dilatation consistent with central atrophy. Low-attenuation changes in the deep white matter consistent with small vessel ischemia. Prominent vascular calcifications. No mass effect or midline shift. No abnormal extra-axial fluid collections. Gray-white matter junctions are distinct. Basal cisterns are not effaced. No evidence of acute intracranial hemorrhage. No depressed skull fractures. Mucosal thickening in the right maxillary antrum. Mastoid air cells are not opacified. CT CERVICAL SPINE FINDINGS There is reversal of the usual cervical lordosis without anterior subluxation. This may be due to patient positioning or degenerative change but ligamentous injury or muscle spasm can also have this appearance and are not excluded. No vertebral compression deformities. Degenerative changes throughout the cervical spine with narrowed interspaces and endplate hypertrophic changes. Degenerative changes in the facet joints. Uncovertebral and facet joint spurring causes encroachment upon the neural foramina at multiple  levels bilaterally. No prevertebral soft tissue swelling. No vertebral compression deformities. C1-2 articulation appears intact. Sclerosis in the right pedicle of C2 is likely to represent a benign bone island. Soft tissues are unremarkable. Vascular calcifications. IMPRESSION: No acute intracranial abnormalities. Prominent chronic atrophy and small vessel ischemic changes. Nonspecific reversal of the usual cervical lordosis. Diffuse degenerative changes in the cervical spine. No acute displaced fractures identified. Electronically Signed   By: Burman NievesWilliam  Stevens M.D.   On: 10/11/2015 06:34   Ct Cervical Spine Wo Contrast  10/11/2015  CLINICAL DATA:  Patient fell out of bed. Now with head and neck pain. EXAM: CT HEAD WITHOUT CONTRAST CT CERVICAL SPINE WITHOUT CONTRAST TECHNIQUE: Multidetector CT imaging of the head and cervical spine was performed following the standard protocol without intravenous contrast. Multiplanar CT image reconstructions of the cervical spine were also generated. COMPARISON:  None. FINDINGS: CT HEAD FINDINGS Diffuse cerebral atrophy. Ventricular dilatation consistent with central atrophy. Low-attenuation changes in the deep white matter consistent with small vessel ischemia. Prominent vascular calcifications. No mass effect or midline shift. No abnormal extra-axial fluid collections. Gray-white matter junctions are distinct. Basal cisterns are not effaced. No evidence of acute intracranial hemorrhage. No depressed skull fractures. Mucosal thickening in the right maxillary antrum. Mastoid air cells are not opacified. CT CERVICAL SPINE FINDINGS There is reversal of the usual cervical lordosis without anterior subluxation. This may be due to patient positioning or degenerative change but ligamentous injury or muscle spasm can also have this appearance and are not excluded. No vertebral compression deformities. Degenerative changes throughout the cervical spine with narrowed interspaces and  endplate hypertrophic changes. Degenerative changes in the facet joints. Uncovertebral and facet joint spurring causes encroachment upon the neural foramina at multiple levels bilaterally. No prevertebral soft tissue swelling. No vertebral compression deformities. C1-2 articulation appears intact. Sclerosis in the right pedicle of C2 is likely to represent a benign bone island. Soft tissues are unremarkable. Vascular calcifications. IMPRESSION: No acute intracranial abnormalities. Prominent chronic atrophy and small vessel ischemic changes. Nonspecific reversal of the usual cervical lordosis. Diffuse degenerative changes in the cervical spine. No acute displaced fractures  identified. Electronically Signed   By: Burman Nieves M.D.   On: 10/11/2015 06:34   Dg Pelvis Portable  10/13/2015  CLINICAL DATA:  Status post surgical reduction of the dislocated left hip prosthesis. EXAM: PORTABLE PELVIS 1-2 VIEWS COMPARISON:  10/11/2015 FINDINGS: On the 2 AP views acquired, the femoral and acetabular prosthetic components are well aligned. There is no acute fracture. There is no evidence of prosthetic loosening. IMPRESSION: Well aligned left hip prosthesis. Electronically Signed   By: Amie Portland M.D.   On: 10/13/2015 14:45   Pelvis Portable  10/11/2015  CLINICAL DATA:  Status post left hip replacement today. Postoperative exam. EXAM: PORTABLE PELVIS 1-2 VIEWS COMPARISON:  None. FINDINGS: Left total hip arthroplasty is in place. There is some gas in the soft tissues from surgery and surgical staples are noted. The device is located and there is no fracture. IMPRESSION: Left total hip replacement without complication. Electronically Signed   By: Drusilla Kanner M.D.   On: 10/11/2015 20:01   Dg Chest Port 1 View  10/11/2015  CLINICAL DATA:  Preoperative evaluation.  History of tobacco use EXAM: PORTABLE CHEST 1 VIEW COMPARISON:  None. FINDINGS: Calcified pleural plaques are noted on the left. There is no edema or  consolidation. Heart size and pulmonary vascularity are normal. No adenopathy. There is atherosclerotic calcification in the aorta. No bone lesions are evident. IMPRESSION: Calcified pleural plaques on the left. Question history of asbestos exposure. No edema or consolidation. Electronically Signed   By: Bretta Bang III M.D.   On: 10/11/2015 08:49   Dg Hip Operative Unilat With Pelvis Left  10/13/2015  CLINICAL DATA:  It s/p surgical reduction of a hip prosthesis dislocation. EXAM: OPERATIVE left HIP (WITH PELVIS IF PERFORMED) 3 VIEWS TECHNIQUE: Fluoroscopic spot image(s) were submitted for interpretation post-operatively. COMPARISON:  10/13/2015 at 9:49 a.m. FINDINGS: The femoral acetabular prosthetic components have been reduced into normal alignment. There is no acute fracture. There is no evidence of prosthetic component loosening. IMPRESSION: Well aligned left hip prosthesis following surgical reduction. Electronically Signed   By: Amie Portland M.D.   On: 10/13/2015 14:44   Dg Hip Operative Unilat W Or W/o Pelvis Left  10/11/2015  CLINICAL DATA:  Left hip replacement.  Intraoperative imaging. EXAM: OPERATIVE LEFT HIP (WITH PELVIS IF PERFORMED) 4 VIEWS TECHNIQUE: Fluoroscopic spot image(s) were submitted for interpretation post-operatively. COMPARISON:  Plain films the early hips 02/11/2016. FINDINGS: We are provided with 4 fluoroscopic intraoperative spot views of the lower pelvis and left hip. Images demonstrate a new left hip arthroplasty in place. No fracture or other acute abnormality is identified. IMPRESSION: Left hip replacement.  No acute finding. Electronically Signed   By: Drusilla Kanner M.D.   On: 10/11/2015 19:05   Dg Hip Unilat With Pelvis 2-3 Views Left  10/13/2015  CLINICAL DATA:  Status post left total hip arthroplasty 10/11/2015. EXAM: DG HIP (WITH OR WITHOUT PELVIS) 2-3V LEFT COMPARISON:  10/11/2015 pelvic radiograph FINDINGS: There is superolateral dislocation of the left  femoral head prosthesis at the left hip joint. The left acetabular prosthesis appears well positioned. No evidence of prosthetic fracture or loosening. No new osseous fracture or suspicious focal osseous lesion. Skin staples overlie the left hip. Soft tissue emphysema lateral to the left hip. IMPRESSION: Superolateral dislocation of the left femoral head prosthesis at the left hip joint. These results will be called to the ordering clinician or representative by the Radiologist Assistant, and communication documented in the PACS or zVision Dashboard. Electronically  Signed   By: Delbert Phenix M.D.   On: 10/13/2015 10:24   Dg Hips Bilat With Pelvis 3-4 Views  10/11/2015  CLINICAL DATA:  Initial evaluation for acute seizure, fall. EXAM: DG HIP (WITH OR WITHOUT PELVIS) 3-4V BILAT COMPARISON:  None. FINDINGS: Bones are diffusely osteopenia, somewhat limiting evaluation. There is an acute subcapital fracture of through the left femoral neck with minimal displacement and slight impaction. Femoral head itself remains position within the acetabulum. Femoral head height preserved. Remainder of the visualized left femoral shaft intact. Bony acetabulum on the left is intact. Right hip intact. Right femoral head normally position within the acetabulum. Right femoral head and neck intact. Visualized proximal right femoral shaft intact. Visualized bony pelvis intact.  SI joints approximated. Mild degenerative changes noted within the lower lumbar spine. No acute soft tissue abnormality.  Vascular calcifications noted. IMPRESSION: 1. Acute subcapital fracture of the proximal left femoral neck with slight displacement and impaction. 2. No acute fracture or dislocation about the right hip. 3. Diffuse osteopenia. Electronically Signed   By: Rise Mu M.D.   On: 10/11/2015 06:38    Assessment/Plan  Physical deconditioning Limited mobility and mostly in bed for last few months per therapy team with hx from daughter.  Will have him work with physical therapy and occupational therapy team to help with gait training and muscle strengthening exercises.fall precautions. Skin care. Encourage to be out of bed.   Left femoral fracture S/p arthroplasty. Will have patient work with PT/OT as tolerated to regain strength and restore function.  Fall precautions are in place. Has follow up with orthopedics. Continue xarelto for dvt prophylaxis. Continue robaxin 500 mg q6h prn muscle spasm. Continue tylenol 1000 mg bid for pain with norco 16109 mg 1-2 tab q6h prn pain  Protein calorie malnutrition Monitor po intake and get dietary consult. Continue protein supplement  Dysuria Send u/a with c/s. hydrationencouraged  Blood loss anemia Post op, monitor cbc  Seizure disorder Remains seizure free. Continue topamax 50 mg bid  History of CVA Continue aspirin for now  Hyponatremia Monitor bmp  Osteoporosis Continue fosamax once a week, started this admission  Depression Get psych consult. Continue celexa 10 mg daily    Goals of care: short term rehabilitation   Labs/tests ordered: cbc with diff, cmp, u/a with c/s   Family/ staff Communication: reviewed care plan with patient and nursing supervisor    Oneal Grout, MD Internal Medicine [facility] [address] [place], [place] [address] Cell Phone (Monday-Friday 8 am - 5 pm): [phone] On Call: [phone] and follow prompts after 5 pm and on weekends Office Phone: [phone] Office Fax: [phone]

## 2015-10-26 ENCOUNTER — Ambulatory Visit: Payer: Medicare (Managed Care) | Admitting: Internal Medicine

## 2015-11-02 ENCOUNTER — Other Ambulatory Visit: Payer: Self-pay

## 2015-11-02 MED ORDER — HYDROCODONE-ACETAMINOPHEN 5-325 MG PO TABS: 1.0000 | ORAL_TABLET | Freq: Four times a day (QID) | ORAL | Status: DC | PRN

## 2015-11-02 NOTE — Telephone Encounter (Signed)
Rx faxed to [facility] @ [phone], phone number [phone]  

## 2015-11-07 ENCOUNTER — Encounter: Payer: Self-pay | Admitting: Family

## 2015-11-07 ENCOUNTER — Non-Acute Institutional Stay (SKILLED_NURSING_FACILITY): Payer: Medicare Other | Admitting: Family

## 2015-11-07 DIAGNOSIS — D509 Iron deficiency anemia, unspecified: Secondary | ICD-10-CM | POA: Diagnosis not present

## 2015-11-07 DIAGNOSIS — R195 Other fecal abnormalities: Secondary | ICD-10-CM | POA: Diagnosis not present

## 2015-11-07 NOTE — Progress Notes (Signed)
Location:    Nursing Home Room Number: 902 Place of Service: [facility]  Provider:  Richarda Blade FNP-C   No primary care provider on file.  No care team member to display  Extended Emergency Contact Information Primary Emergency Contact: Romano,Donna Address: [address]          [place], [place] [address] [place] Home Phone: [phone] Relation: Daughter Secondary Emergency Contact: Bernestine Amass Address: [place] Home Phone: [phone] Relation: Spouse  Code Status: DNR Goals of care: Advanced Directive information Advanced Directives 11/07/2015  Does patient have an advance directive? Yes  Type of Advance Directive Out of facility DNR (pink MOST or yellow form)  Does patient want to make changes to advanced directive? No - Patient declined  Copy of advanced directive(s) in chart? Yes     Chief Complaint  Patient presents with  . Acute Visit    HPI:  Pt is a 81 y.o. male seen today at [facility]  for an acute visit for evaluation of abnormal lab results. He has a medical history of Stroke, Seizures disorder, Hepatitis C, Status post left total hip arthroplasty on 10/11/15. He is seen in his room today. He is pleasantly confused. His recent lab results showed Hgb 8.4, HCT 26. Facility Nurse reports dark red stool hemoccult positive x 1 stool. He is current on DVT prophylaxis ASA and Xarelto.    Past Medical History  Diagnosis Date  . Stroke (HCC)   . Seizure disorder (HCC)   . Hepatitis C    Past Surgical History  Procedure Laterality Date  . Total hip arthroplasty Left 10/11/2015    Procedure: TOTAL HIP ARTHROPLASTY ANTERIOR APPROACH;  Surgeon: Kathryne Hitch, MD;  Location: [facility];  Service: Orthopedics;  Laterality: Left;  . Total hip revision Left 10/13/2015    Procedure:   REVISION OF LEFT HIP BALL;  Surgeon: Kathryne Hitch, MD;  Location: [facility] OR;   Service: Orthopedics;  Laterality: Left;    Allergies  Allergen Reactions  . Penicillins Rash    Note patient had ancef on 10/11/15 with no reported complications      Medication List       This list is accurate as of: 11/07/15  1:20 PM.  Always use your most recent med list.               acetaminophen 500 MG tablet  Commonly known as:  TYLENOL  Take 1,000 mg by mouth 2 (two) times daily.     alendronate 70 MG tablet  Commonly known as:  FOSAMAX  Take 1 tablet (70 mg total) by mouth once a week. Take with a full glass of water on an empty stomach.     aspirin 81 MG chewable tablet  Chew 81 mg by mouth daily.     atorvastatin 10 MG tablet  Commonly known as:  LIPITOR  Take 10 mg by mouth daily.     citalopram 10 MG tablet  Commonly known as:  CELEXA  Take 10 mg by mouth daily.     HYDROcodone-acetaminophen 5-325 MG tablet  Commonly known as:  NORCO/VICODIN  Take 1-2 tablets by mouth every 6 (six) hours as needed for moderate pain.     methocarbamol 500 MG tablet  Commonly known as:  ROBAXIN  Take 1 tablet (500 mg total) by mouth every 6 (six) hours as needed for muscle spasms.  rivaroxaban 10 MG Tabs tablet  Commonly known as:  XARELTO  Take 1 tablet (10 mg total) by mouth daily with supper. Stop after November 17, 2015     tamsulosin 0.4 MG Caps capsule  Commonly known as:  FLOMAX  Take 0.4 mg by mouth at bedtime.     topiramate 50 MG tablet  Commonly known as:  TOPAMAX  Take 50 mg by mouth 2 (two) times daily.     UNABLE TO FIND  Med Name: Med pass 120 mL by mouth daily        Review of Systems  Unable to perform ROS: Psychiatric disorder     There is no immunization history on file for this patient. Pertinent  Health Maintenance Due  Topic Date Due  . PNA vac Low Risk Adult (1 of 2 - PCV13) 10/18/2001  . INFLUENZA VACCINE  12/25/2015   No flowsheet data found. Functional Status Survey:    Filed Vitals:   11/07/15 1313  BP: 114/71    Pulse: 85  Temp: 97.9 F (36.6 C)  TempSrc: Oral  Resp: 20  Weight: 126 lb 6.4 oz (57.335 kg)  SpO2: 98%   There is no height on file to calculate BMI. Physical Exam  Constitutional:  Thin Frail Elderly in no acute distress   HENT:  Head: Normocephalic.  Mouth/Throat: Oropharynx is clear and moist. No oropharyngeal exudate.  Eyes: Conjunctivae and EOM are normal. Pupils are equal, round, and reactive to light. Right eye exhibits no discharge. Left eye exhibits no discharge. No scleral icterus.  Neck: Normal range of motion. No JVD present.  Cardiovascular: Normal rate, regular rhythm and intact distal pulses.  Exam reveals no gallop and no friction rub.   No murmur heard. Pulmonary/Chest: Effort normal and breath sounds normal. No respiratory distress. He has no wheezes. He has no rales.  Abdominal: Soft. Bowel sounds are normal. He exhibits no distension. There is no tenderness. There is no rebound and no guarding.  Musculoskeletal: He exhibits no edema or tenderness.  Left hip limited due to pain   Lymphadenopathy:    He has no cervical adenopathy.  Neurological:  Alert but pleasantly confused and easily irritable.   Skin: Skin is warm and dry. No rash noted. No erythema. No pallor.  Psychiatric:  easily irritable. Lying in bed with window blinds closed and wants lights off.     Labs reviewed:  Recent Labs  10/13/15 0544 10/14/15 0427 10/15/15 10/15/15 0304  NA 131* 129* 130* 130*  K 3.6 3.4*  --  3.8  CL 100* 99*  --  101  CO2 22 24  --  22  GLUCOSE 99 110*  --  90  BUN 19 12 11 11   CREATININE 1.19 0.97 0.9 0.90  CALCIUM 8.2* 7.9*  --  8.1*  MG  --   --   --  1.9    Recent Labs  10/11/15 0950  AST 53*  ALT 42  ALKPHOS 76  BILITOT 1.3*  PROT 6.7  ALBUMIN 3.1*    Recent Labs  10/11/15 0541  10/13/15 0544 10/14/15 0427 10/15/15 10/15/15 0304  WBC 5.3  < > 6.3 5.1 4.9 4.9  NEUTROABS 2.0  --   --   --   --   --   HGB 12.9*  < > 9.5* 7.9*  --  8.3*   HCT 37.6*  < > 28.5* 23.1*  --  24.3*  MCV 94.2  < > 94.1 93.1  --  92.4  PLT 150  < > 176 153  --  192  < > = values in this interval not displayed. No results found for: TSH No results found for: HGBA1C No results found for: CHOL, HDL, LDLCALC, LDLDIRECT, TRIG, CHOLHDL  Significant Diagnostic Results in last 30 days:  Ct Head Wo Contrast  10/11/2015  CLINICAL DATA:  Patient fell out of bed. Now with head and neck pain. EXAM: CT HEAD WITHOUT CONTRAST CT CERVICAL SPINE WITHOUT CONTRAST TECHNIQUE: Multidetector CT imaging of the head and cervical spine was performed following the standard protocol without intravenous contrast. Multiplanar CT image reconstructions of the cervical spine were also generated. COMPARISON:  None. FINDINGS: CT HEAD FINDINGS Diffuse cerebral atrophy. Ventricular dilatation consistent with central atrophy. Low-attenuation changes in the deep white matter consistent with small vessel ischemia. Prominent vascular calcifications. No mass effect or midline shift. No abnormal extra-axial fluid collections. Gray-white matter junctions are distinct. Basal cisterns are not effaced. No evidence of acute intracranial hemorrhage. No depressed skull fractures. Mucosal thickening in the right maxillary antrum. Mastoid air cells are not opacified. CT CERVICAL SPINE FINDINGS There is reversal of the usual cervical lordosis without anterior subluxation. This may be due to patient positioning or degenerative change but ligamentous injury or muscle spasm can also have this appearance and are not excluded. No vertebral compression deformities. Degenerative changes throughout the cervical spine with narrowed interspaces and endplate hypertrophic changes. Degenerative changes in the facet joints. Uncovertebral and facet joint spurring causes encroachment upon the neural foramina at multiple levels bilaterally. No prevertebral soft tissue swelling. No vertebral compression deformities. C1-2  articulation appears intact. Sclerosis in the right pedicle of C2 is likely to represent a benign bone island. Soft tissues are unremarkable. Vascular calcifications. IMPRESSION: No acute intracranial abnormalities. Prominent chronic atrophy and small vessel ischemic changes. Nonspecific reversal of the usual cervical lordosis. Diffuse degenerative changes in the cervical spine. No acute displaced fractures identified. Electronically Signed   By: Burman Nieves M.D.   On: 10/11/2015 06:34   Ct Cervical Spine Wo Contrast  10/11/2015  CLINICAL DATA:  Patient fell out of bed. Now with head and neck pain. EXAM: CT HEAD WITHOUT CONTRAST CT CERVICAL SPINE WITHOUT CONTRAST TECHNIQUE: Multidetector CT imaging of the head and cervical spine was performed following the standard protocol without intravenous contrast. Multiplanar CT image reconstructions of the cervical spine were also generated. COMPARISON:  None. FINDINGS: CT HEAD FINDINGS Diffuse cerebral atrophy. Ventricular dilatation consistent with central atrophy. Low-attenuation changes in the deep white matter consistent with small vessel ischemia. Prominent vascular calcifications. No mass effect or midline shift. No abnormal extra-axial fluid collections. Gray-white matter junctions are distinct. Basal cisterns are not effaced. No evidence of acute intracranial hemorrhage. No depressed skull fractures. Mucosal thickening in the right maxillary antrum. Mastoid air cells are not opacified. CT CERVICAL SPINE FINDINGS There is reversal of the usual cervical lordosis without anterior subluxation. This may be due to patient positioning or degenerative change but ligamentous injury or muscle spasm can also have this appearance and are not excluded. No vertebral compression deformities. Degenerative changes throughout the cervical spine with narrowed interspaces and endplate hypertrophic changes. Degenerative changes in the facet joints. Uncovertebral and facet joint  spurring causes encroachment upon the neural foramina at multiple levels bilaterally. No prevertebral soft tissue swelling. No vertebral compression deformities. C1-2 articulation appears intact. Sclerosis in the right pedicle of C2 is likely to represent a benign bone island. Soft tissues are unremarkable. Vascular calcifications. IMPRESSION:  No acute intracranial abnormalities. Prominent chronic atrophy and small vessel ischemic changes. Nonspecific reversal of the usual cervical lordosis. Diffuse degenerative changes in the cervical spine. No acute displaced fractures identified. Electronically Signed   By: Burman Nieves M.D.   On: 10/11/2015 06:34   Dg Pelvis Portable  10/13/2015  CLINICAL DATA:  Status post surgical reduction of the dislocated left hip prosthesis. EXAM: PORTABLE PELVIS 1-2 VIEWS COMPARISON:  10/11/2015 FINDINGS: On the 2 AP views acquired, the femoral and acetabular prosthetic components are well aligned. There is no acute fracture. There is no evidence of prosthetic loosening. IMPRESSION: Well aligned left hip prosthesis. Electronically Signed   By: Amie Portland M.D.   On: 10/13/2015 14:45   Pelvis Portable  10/11/2015  CLINICAL DATA:  Status post left hip replacement today. Postoperative exam. EXAM: PORTABLE PELVIS 1-2 VIEWS COMPARISON:  None. FINDINGS: Left total hip arthroplasty is in place. There is some gas in the soft tissues from surgery and surgical staples are noted. The device is located and there is no fracture. IMPRESSION: Left total hip replacement without complication. Electronically Signed   By: Drusilla Kanner M.D.   On: 10/11/2015 20:01   Dg Chest Port 1 View  10/11/2015  CLINICAL DATA:  Preoperative evaluation.  History of tobacco use EXAM: PORTABLE CHEST 1 VIEW COMPARISON:  None. FINDINGS: Calcified pleural plaques are noted on the left. There is no edema or consolidation. Heart size and pulmonary vascularity are normal. No adenopathy. There is atherosclerotic  calcification in the aorta. No bone lesions are evident. IMPRESSION: Calcified pleural plaques on the left. Question history of asbestos exposure. No edema or consolidation. Electronically Signed   By: Bretta Bang III M.D.   On: 10/11/2015 08:49   Dg Hip Operative Unilat With Pelvis Left  10/13/2015  CLINICAL DATA:  It s/p surgical reduction of a hip prosthesis dislocation. EXAM: OPERATIVE left HIP (WITH PELVIS IF PERFORMED) 3 VIEWS TECHNIQUE: Fluoroscopic spot image(s) were submitted for interpretation post-operatively. COMPARISON:  10/13/2015 at 9:49 a.m. FINDINGS: The femoral acetabular prosthetic components have been reduced into normal alignment. There is no acute fracture. There is no evidence of prosthetic component loosening. IMPRESSION: Well aligned left hip prosthesis following surgical reduction. Electronically Signed   By: Amie Portland M.D.   On: 10/13/2015 14:44   Dg Hip Operative Unilat W Or W/o Pelvis Left  10/11/2015  CLINICAL DATA:  Left hip replacement.  Intraoperative imaging. EXAM: OPERATIVE LEFT HIP (WITH PELVIS IF PERFORMED) 4 VIEWS TECHNIQUE: Fluoroscopic spot image(s) were submitted for interpretation post-operatively. COMPARISON:  Plain films the early hips 02/11/2016. FINDINGS: We are provided with 4 fluoroscopic intraoperative spot views of the lower pelvis and left hip. Images demonstrate a new left hip arthroplasty in place. No fracture or other acute abnormality is identified. IMPRESSION: Left hip replacement.  No acute finding. Electronically Signed   By: Drusilla Kanner M.D.   On: 10/11/2015 19:05   Dg Hip Unilat With Pelvis 2-3 Views Left  10/13/2015  CLINICAL DATA:  Status post left total hip arthroplasty 10/11/2015. EXAM: DG HIP (WITH OR WITHOUT PELVIS) 2-3V LEFT COMPARISON:  10/11/2015 pelvic radiograph FINDINGS: There is superolateral dislocation of the left femoral head prosthesis at the left hip joint. The left acetabular prosthesis appears well positioned. No  evidence of prosthetic fracture or loosening. No new osseous fracture or suspicious focal osseous lesion. Skin staples overlie the left hip. Soft tissue emphysema lateral to the left hip. IMPRESSION: Superolateral dislocation of the left femoral head prosthesis  at the left hip joint. These results will be called to the ordering clinician or representative by the Radiologist Assistant, and communication documented in the PACS or zVision Dashboard. Electronically Signed   By: Delbert Phenix M.D.   On: 10/13/2015 10:24   Dg Hips Bilat With Pelvis 3-4 Views  10/11/2015  CLINICAL DATA:  Initial evaluation for acute seizure, fall. EXAM: DG HIP (WITH OR WITHOUT PELVIS) 3-4V BILAT COMPARISON:  None. FINDINGS: Bones are diffusely osteopenia, somewhat limiting evaluation. There is an acute subcapital fracture of through the left femoral neck with minimal displacement and slight impaction. Femoral head itself remains position within the acetabulum. Femoral head height preserved. Remainder of the visualized left femoral shaft intact. Bony acetabulum on the left is intact. Right hip intact. Right femoral head normally position within the acetabulum. Right femoral head and neck intact. Visualized proximal right femoral shaft intact. Visualized bony pelvis intact.  SI joints approximated. Mild degenerative changes noted within the lower lumbar spine. No acute soft tissue abnormality.  Vascular calcifications noted. IMPRESSION: 1. Acute subcapital fracture of the proximal left femoral neck with slight displacement and impaction. 2. No acute fracture or dislocation about the right hip. 3. Diffuse osteopenia. Electronically Signed   By: Rise Mu M.D.   On: 10/11/2015 06:38    Assessment/Plan Anemia Hgb 8.4 previous 8.3 (10/15/2015). Dark reddish stool reported by Nurse hemoccult  Positive X 1. Start  Ferrous sulfate 325 mg tablet one by mouth daily.Colace 100 mg Capsule daily. Repeat CBC  11/12/2015   Positive  Occult stool blood test  D/C Asprin 81 mg tablet. Hold Xarelto for now. Start Protonix 40 mg tablet daily. Hemoccult stool x 2 more specimen.    Family/ staff Communication: Reviewed plan of care with patient and facility Nurse supervisor. Labs/tests ordered: CBC  11/12/2015

## 2015-11-12 ENCOUNTER — Non-Acute Institutional Stay (SKILLED_NURSING_FACILITY): Payer: Medicare Other | Admitting: Internal Medicine

## 2015-11-12 ENCOUNTER — Encounter: Payer: Self-pay | Admitting: Internal Medicine

## 2015-11-12 DIAGNOSIS — E46 Unspecified protein-calorie malnutrition: Secondary | ICD-10-CM | POA: Diagnosis not present

## 2015-11-12 DIAGNOSIS — D62 Acute posthemorrhagic anemia: Secondary | ICD-10-CM

## 2015-11-12 DIAGNOSIS — A499 Bacterial infection, unspecified: Secondary | ICD-10-CM

## 2015-11-12 DIAGNOSIS — Z1612 Extended spectrum beta lactamase (ESBL) resistance: Secondary | ICD-10-CM

## 2015-11-12 DIAGNOSIS — S72002S Fracture of unspecified part of neck of left femur, sequela: Secondary | ICD-10-CM | POA: Diagnosis not present

## 2015-11-12 DIAGNOSIS — G40909 Epilepsy, unspecified, not intractable, without status epilepticus: Secondary | ICD-10-CM | POA: Diagnosis not present

## 2015-11-12 DIAGNOSIS — F329 Major depressive disorder, single episode, unspecified: Secondary | ICD-10-CM | POA: Diagnosis not present

## 2015-11-12 NOTE — Progress Notes (Signed)
LOCATION: [name]  PCP: No primary care provider on file.   Code Status: DNR  Goals of care: Advanced Directive information Advanced Directives 11/07/2015  Does patient have an advance directive? Yes  Type of Advance Directive Out of facility DNR (pink MOST or yellow form)  Does patient want to make changes to advanced directive? No - Patient declined  Copy of advanced directive(s) in chart? Yes       Extended Emergency Contact Information Primary Emergency Contact: Romano,Donna Address: [address]          [place], [place] [address] [place] Home Phone: [phone] Relation: Daughter Secondary Emergency Contact: Bernestine Amass Address: [place] Home Phone: [phone] Relation: Spouse   Allergies  Allergen Reactions  . Penicillins Rash    Note patient had ancef on 10/11/15 with no reported complications    Chief Complaint  Patient presents with  . Discharge Note     HPI:  Patient is a 81 y.o. male seen today for discharge visit. He is being transferred to another SNF for long term care. He was here for short term rehabilitation post hospital admission from 10/11/15-10/16/15 post fall with left displaced femoral neck fracture status post total hip arthroplasty on 10/11/15. He is seen in his room today. He has worked with therapy team during his stay. He is currently on antibiotics for urinary tract infection. He is pleasantly confused and gets agitated easily   Review of Systems: limited Constitutional: Negative for fever HENT: Negative for headache, congestion Eyes: Negative for blurred vision  Respiratory: Negative for cough, shortness of breath and wheezing.   Cardiovascular: Negative for chest pain, palpitations, leg swelling.  Gastrointestinal: Negative for heartburn, nausea, vomiting, abdominal pain.  GU: has burning and pain with urination Neurological: Negative for dizziness.    Past Medical  History  Diagnosis Date  . Stroke (HCC)   . Seizure disorder (HCC)   . Hepatitis C    Past Surgical History  Procedure Laterality Date  . Total hip arthroplasty Left 10/11/2015    Procedure: TOTAL HIP ARTHROPLASTY ANTERIOR APPROACH;  Surgeon: Kathryne Hitch, MD;  Location: [facility] OR;  Service: Orthopedics;  Laterality: Left;  . Total hip revision Left 10/13/2015    Procedure:   REVISION OF LEFT HIP BALL;  Surgeon: Kathryne Hitch, MD;  Location: [facility] OR;  Service: Orthopedics;  Laterality: Left;   Social History:   reports that he has quit smoking. He does not have any smokeless tobacco history on file. He reports that he does not drink alcohol or use illicit drugs.  Family History  Problem Relation Age of Onset  . Heart disease Other     unknown, patient says family members died long ago and he cannot remember    Medications:   Medication List       This list is accurate as of: 11/12/15  2:28 PM.  Always use your most recent med list.               acetaminophen 500 MG tablet  Commonly known as:  TYLENOL  Take 1,000 mg by mouth 2 (two) times daily.     alendronate 70 MG tablet  Commonly known as:  FOSAMAX  Take 1 tablet (70 mg total) by mouth once a week. Take with a full glass of water on an empty stomach.     atorvastatin 10 MG tablet  Commonly known as:  LIPITOR  Take 10 mg by  mouth daily.     citalopram 10 MG tablet  Commonly known as:  CELEXA  Take 10 mg by mouth daily.     docusate sodium 100 MG capsule  Commonly known as:  COLACE  Take 100 mg by mouth daily.     ferrous sulfate 325 (65 FE) MG tablet  Take 325 mg by mouth daily with breakfast.     HYDROcodone-acetaminophen 5-325 MG tablet  Commonly known as:  NORCO/VICODIN  Take 1-2 tablets by mouth every 6 (six) hours as needed for moderate pain.     levofloxacin 750 MG tablet  Commonly known as:  LEVAQUIN  Take 750 mg by mouth daily. Stop date 11/14/15     MEROPENEM IV  Inject 1 g into  the vein every 8 (eight) hours. Stop date 11/16/15     methocarbamol 500 MG tablet  Commonly known as:  ROBAXIN  Take 1 tablet (500 mg total) by mouth every 6 (six) hours as needed for muscle spasms.     pantoprazole 40 MG tablet  Commonly known as:  PROTONIX  Take 40 mg by mouth daily.     tamsulosin 0.4 MG Caps capsule  Commonly known as:  FLOMAX  Take 0.4 mg by mouth at bedtime.     topiramate 50 MG tablet  Commonly known as:  TOPAMAX  Take 50 mg by mouth 2 (two) times daily.     UNABLE TO FIND  Med Name: Med pass 120 mL by mouth daily        Immunizations:  There is no immunization history on file for this patient.   Physical Exam: Filed Vitals:   11/12/15 1412  BP: 136/73  Pulse: 80  Temp: 98.9 F (37.2 C)  TempSrc: Oral  Resp: 18  Height: 5\' 8"  (1.727 m)  Weight: 126 lb 6.4 oz (57.335 kg)  SpO2: 98%    General- elderly frail male, in no acute distress Head- normocephalic, atraumatic Nose- no nasal discharge Throat- moist mucus membrane, poor dentition Eyes- PERRLA, EOMI, no pallor, no icterus, no discharge Neck- no cervical lymphadenopathy Cardiovascular- normal s1,s2, no murmur, no leg edema Respiratory- bilateral clear to auscultation, no wheeze, no rhonchi, no crackles, no use of accessory muscles Abdomen- bowel sounds present, soft, non tender Musculoskeletal- able to move all 4 extremities, limited left hip range of motion Neurological- alert and oriented to person and place Skin- warm and dry, left hip surgical incision with few steri strips in place, healing well Psychiatry- agitated easily    Labs reviewed: Basic Metabolic Panel:  Recent Labs  16/10/96 0544 10/14/15 0427 10/15/15 10/15/15 0304  NA 131* 129* 130* 130*  K 3.6 3.4*  --  3.8  CL 100* 99*  --  101  CO2 22 24  --  22  GLUCOSE 99 110*  --  90  BUN 19 12 11 11   CREATININE 1.19 0.97 0.9 0.90  CALCIUM 8.2* 7.9*  --  8.1*  MG  --   --   --  1.9   Liver Function  Tests:  Recent Labs  10/11/15 0950  AST 53*  ALT 42  ALKPHOS 76  BILITOT 1.3*  PROT 6.7  ALBUMIN 3.1*   No results for input(s): LIPASE, AMYLASE in the last 8760 hours. No results for input(s): AMMONIA in the last 8760 hours. CBC:  Recent Labs  10/11/15 0541  10/13/15 0544 10/14/15 0427 10/15/15 10/15/15 0304  WBC 5.3  < > 6.3 5.1 4.9 4.9  NEUTROABS 2.0  --   --   --   --   --  HGB 12.9*  < > 9.5* 7.9*  --  8.3*  HCT 37.6*  < > 28.5* 23.1*  --  24.3*  MCV 94.2  < > 94.1 93.1  --  92.4  PLT 150  < > 176 153  --  192  < > = values in this interval not displayed.   Radiological Exams: Dg Pelvis Portable  10/13/2015  CLINICAL DATA:  Status post surgical reduction of the dislocated left hip prosthesis. EXAM: PORTABLE PELVIS 1-2 VIEWS COMPARISON:  10/11/2015 FINDINGS: On the 2 AP views acquired, the femoral and acetabular prosthetic components are well aligned. There is no acute fracture. There is no evidence of prosthetic loosening. IMPRESSION: Well aligned left hip prosthesis. Electronically Signed   By: Amie Portlandavid  Ormond M.D.   On: 10/13/2015 14:45    Assessment/Plan  ESBL UTI Continue meropenem 1 gm iv tid x 1 week and levaquin 750 mg daily x 5 days from 11/10/15. hydration encouraged. PICC line care  Left femoral fracture S/p arthroplasty. Will have patient work with PT/OT as tolerated to regain strength and restore function with restorative team.  Fall precautions are in place. His xarelto has been held with guaiac positive stool and drop of Hb. Will need follow up on Hb in another facility.Continue robaxin 500 mg q6h prn muscle spasm. Continue tylenol 1000 mg bid for pain with norco 5- 325 mg 1-2 tab q6h prn pain  Protein calorie malnutrition Monitor po intake and continue protein supplement  Blood loss anemia Post op, monitor cbc, continue feso4, hold xarelto. Aspirin has been discontinued for now  Seizure disorder Remains seizure free. Continue topamax 50 mg  bid  Chronic depression Continue celexa for now and monitor  An updated medication list will be sent to another facility for continuation of care. No hard scripts provided.     Family/ staff Communication: reviewed care plan with patient and nursing supervisor    Oneal GroutMAHIMA Lavel Rieman, MD Internal Medicine [facility] [address] [place], [place] [address] Cell Phone (Monday-Friday 8 am - 5 pm): [phone] On Call: [phone] and follow prompts after 5 pm and on weekends Office Phone: [phone] Office Fax: [phone]

## 2015-11-15 ENCOUNTER — Non-Acute Institutional Stay (SKILLED_NURSING_FACILITY): Payer: Medicare Other | Admitting: Internal Medicine

## 2015-11-15 ENCOUNTER — Encounter: Payer: Self-pay | Admitting: Internal Medicine

## 2015-11-15 DIAGNOSIS — E785 Hyperlipidemia, unspecified: Secondary | ICD-10-CM

## 2015-11-15 DIAGNOSIS — K5900 Constipation, unspecified: Secondary | ICD-10-CM

## 2015-11-15 DIAGNOSIS — Z1612 Extended spectrum beta lactamase (ESBL) resistance: Secondary | ICD-10-CM

## 2015-11-15 DIAGNOSIS — K219 Gastro-esophageal reflux disease without esophagitis: Secondary | ICD-10-CM | POA: Diagnosis not present

## 2015-11-15 DIAGNOSIS — N400 Enlarged prostate without lower urinary tract symptoms: Secondary | ICD-10-CM

## 2015-11-15 DIAGNOSIS — F329 Major depressive disorder, single episode, unspecified: Secondary | ICD-10-CM

## 2015-11-15 DIAGNOSIS — Z8673 Personal history of transient ischemic attack (TIA), and cerebral infarction without residual deficits: Secondary | ICD-10-CM

## 2015-11-15 DIAGNOSIS — E46 Unspecified protein-calorie malnutrition: Secondary | ICD-10-CM

## 2015-11-15 DIAGNOSIS — G40909 Epilepsy, unspecified, not intractable, without status epilepticus: Secondary | ICD-10-CM | POA: Diagnosis not present

## 2015-11-15 DIAGNOSIS — F32A Depression, unspecified: Secondary | ICD-10-CM

## 2015-11-15 DIAGNOSIS — S7292XS Unspecified fracture of left femur, sequela: Secondary | ICD-10-CM

## 2015-11-15 DIAGNOSIS — D62 Acute posthemorrhagic anemia: Secondary | ICD-10-CM | POA: Diagnosis not present

## 2015-11-15 DIAGNOSIS — M810 Age-related osteoporosis without current pathological fracture: Secondary | ICD-10-CM | POA: Diagnosis not present

## 2015-11-15 DIAGNOSIS — A499 Bacterial infection, unspecified: Secondary | ICD-10-CM | POA: Diagnosis not present

## 2015-11-15 DIAGNOSIS — E871 Hypo-osmolality and hyponatremia: Secondary | ICD-10-CM

## 2015-11-15 NOTE — Progress Notes (Signed)
LOCATION: [facility]  PCP: No primary care provider on file.   Code Status: DNR  Goals of care: Advanced Directive information Advanced Directives 11/15/2015  Does patient have an advance directive? Yes  Type of Advance Directive Out of facility DNR (pink MOST or yellow form)  Does patient want to make changes to advanced directive? No - Patient declined  Copy of advanced directive(s) in chart? Yes       Extended Emergency Contact Information Primary Emergency Contact: Romano,Donna Address: [address]          [place], [place] [address] [place] Home Phone: [phone] Relation: Daughter Secondary Emergency Contact: Bernestine AmassAngarola,Mary [place]   [place] Home Phone: [phone] Relation: Spouse   Allergies  Allergen Reactions  . Penicillins Rash    Note patient had ancef on 10/11/15 with no reported complications    Chief Complaint  Patient presents with  . New Admit To SNF    New Admission     HPI:  Patient is a 81 y.o. male seen today for long term care from another facility. He is currently on antibiotics for ESBL UTI. He is seen in his room today. He denies any concerns.  He is s/p left displaced femoral neck fracture with total hip arthroplasty on 10/11/15.    Review of Systems:  Constitutional: Negative for fever, chills, diaphoresis. Energy level slowly coming back.  HENT: Negative for headache, congestion, nasal discharge, hearing loss, sore throat, difficulty swallowing.   Eyes: Negative for blurred vision, double vision and discharge.  Respiratory: Negative for cough, shortness of breath and wheezing.   Cardiovascular: Negative for chest pain, palpitations, leg swelling.  Gastrointestinal: Negative for heartburn, nausea, vomiting, abdominal pain. Last bowel movement was today. Genitourinary: Negative for flank pain.  Musculoskeletal: Negative for back pain, fall in the facility.  Skin: Negative for  itching, rash.  Neurological: Negative for dizziness. Psychiatric/Behavioral: Negative for depression   Past Medical History  Diagnosis Date  . Stroke (HCC)   . Seizure disorder (HCC)   . Hepatitis C    Past Surgical History  Procedure Laterality Date  . Total hip arthroplasty Left 10/11/2015    Procedure: TOTAL HIP ARTHROPLASTY ANTERIOR APPROACH;  Surgeon: Kathryne Hitchhristopher Y Blackman, MD;  [facility];  Service: Orthopedics;  Laterality: Left;  . Total hip revision Left 10/13/2015    Procedure:   REVISION OF LEFT HIP BALL;  Surgeon: Kathryne Hitchhristopher Y Blackman, MD;  [facility];  Service: Orthopedics;  Laterality: Left;   Social History:   reports that he has quit smoking. He does not have any smokeless tobacco history on file. He reports that he does not drink alcohol or use illicit drugs.  Family History  Problem Relation Age of Onset  . Heart disease Other     unknown, patient says family members died long ago and he cannot remember    Medications:   Medication List       This list is accurate as of: 11/15/15 10:36 AM.  Always use your most recent med list.               acetaminophen 500 MG tablet  Commonly known as:  TYLENOL  Take 1,000 mg by mouth 2 (two) times daily.     alendronate 70 MG tablet  Commonly known as:  FOSAMAX  Take 1 tablet (70 mg total) by mouth once a week. Take with a full glass of water on an empty stomach.  atorvastatin 10 MG tablet  Commonly known as:  LIPITOR  Take 10 mg by mouth daily.     citalopram 10 MG tablet  Commonly known as:  CELEXA  Take 10 mg by mouth daily.     docusate sodium 100 MG capsule  Commonly known as:  COLACE  Take 100 mg by mouth daily.     ferrous sulfate 325 (65 FE) MG tablet  Take 325 mg by mouth daily with breakfast.     HEPARIN & NACL LOCK FLUSH IV  Inject 5 mLs into the vein every 8 (eight) hours. Stop date 11/18/15     HYDROcodone-acetaminophen 5-325 MG tablet  Commonly known as:  NORCO/VICODIN    Take 1-2 tablets by mouth every 6 (six) hours as needed for moderate pain.     MEROPENEM IV  Inject 1 g into the vein every 8 (eight) hours. Stop date 11/16/15     methocarbamol 500 MG tablet  Commonly known as:  ROBAXIN  Take 1 tablet (500 mg total) by mouth every 6 (six) hours as needed for muscle spasms.     pantoprazole 40 MG tablet  Commonly known as:  PROTONIX  Take 40 mg by mouth daily.     sodium chloride 0.9 % injection  Inject 10 mLs into the vein every 8 (eight) hours. Stop date 11/18/15     tamsulosin 0.4 MG Caps capsule  Commonly known as:  FLOMAX  Take 0.4 mg by mouth at bedtime.     topiramate 50 MG tablet  Commonly known as:  TOPAMAX  Take 50 mg by mouth 2 (two) times daily.     UNABLE TO FIND  Med Name: Med pass 120 mL by mouth daily        Immunizations:  There is no immunization history on file for this patient.   Physical Exam: Filed Vitals:   11/15/15 0958  BP: 122/79  Pulse: 64  Temp: 98 F (36.7 C)  TempSrc: Oral  Resp: 18  Height:  (1.727 m)  Weight: 126 lb 6.4 oz (57.335 kg)  SpO2: 98%   Body mass index is 19.22 kg/(m^2).  General- elderly male, frail and thin built, in no acute distress Head- normocephalic, atraumatic Nose- no maxillary or frontal sinus tenderness, no nasal discharge Throat- moist mucus membrane, poor dentition  Eyes- PERRLA, EOMI, no pallor, no icterus, no discharge, normal conjunctiva, normal sclera Neck- no cervical lymphadenopathy Cardiovascular- normal s1,s2, no murmur, no leg edema Respiratory- bilateral clear to auscultation, no wheeze, no rhonchi, no crackles, no use of accessory muscles Abdomen- bowel sounds present, soft, non tender Musculoskeletal- able to move all 4 extremities, generalized weakness Neurological- alert and oriented to person, place and time Skin- warm and dry Psychiatry- appears agitated and is mean during conversation    Labs reviewed: Basic Metabolic Panel:  Recent  Labs  10/13/15 0544 10/14/15 0427 10/15/15 10/15/15 0304  NA 131* 129* 130* 130*  K 3.6 3.4*  --  3.8  CL 100* 99*  --  101  CO2 22 24  --  22  GLUCOSE 99 110*  --  90  BUN CREATININE 1.19 0.97 0.9 0.90  CALCIUM 8.2* 7.9*  --  8.1*  MG  --   --   --  1.9   Liver Function Tests:  Recent Labs  10/11/15 0950  AST 53*  ALT 42  ALKPHOS 76  BILITOT 1.3*  PROT 6.7  ALBUMIN 3.1*   No results  for input(s): LIPASE, AMYLASE in the last 8760 hours. No results for input(s): AMMONIA in the last 8760 hours. CBC:  Recent Labs  10/11/15 0541  10/13/15 0544 10/14/15 0427 10/15/15 10/15/15 0304  WBC 5.3  < > 6.3 5.1 4.9 4.9  NEUTROABS 2.0  --   --   --   --   --   HGB 12.9*  < > 9.5* 7.9*  --  8.3*  HCT 37.6*  < > 28.5* 23.1*  --  24.3*  MCV 94.2  < > 94.1 93.1  --  92.4  PLT 150  < > 176 153  --  192  < > = values in this interval not displayed.    Assessment/Plan  Hyponatremia Hydration to be maintained. Check bmp  Protein calorie malnutrition Get dietary consult. Continue protein supplement  Seizure disorder Remains seizure free. Continue topamax 50 mg bid  gerd Stable, continue protonix 40 mg daily  ESBL UTI Continue meropenem 1 gm iv tid x 1 week and levaquin 750 mg daily x 5 days from 11/10/15. hydration encouraged. PICC line care  Left femoral fracture S/p arthroplasty. Will have patient work with PT/OT as tolerated to regain strength and restore function with restorative team.  Fall precautions are in place. Continue robaxin 500 mg q6h prn muscle spasm. Continue tylenol 1000 mg bid for pain with norco 5- 325 mg 1-2 tab q6h prn pain.   Blood loss anemia Post op monitor cbc. Continue ferrous sulfate  BPH  continue flomax  Chronic depression Continue citalopram for now and get psych consult  Constipation Continue colace 100 mg daily  History of CVA Aspirin on hold with guaiac positive stool. Check cbc. If stable hb, consider ec  aspirin.  Osteoporosis Continue fosamax once a week  HLD On statin. Check lipid panel Lipid Panel  No results found for: CHOL, TRIG, HDL, CHOLHDL, VLDL, LDLCALC, LDLDIRECT    Goals of care: long term care   Labs/tests ordered: cbc, cmp, tsh, lipid panel  Family/ staff Communication: reviewed care plan with patient and nursing supervisor    Oneal GroutMAHIMA Mahealani Sulak, MD Internal Medicine [facility] [address] [place], [place] [address] Cell Phone (Monday-Friday 8 am - 5 pm): [phone] On Call: [phone] and follow prompts after 5 pm and on weekends Office Phone: [phone] Office Fax: [phone]

## 2015-11-19 LAB — TSH: TSH: 23.47 u[IU]/mL — AB (ref 0.41–5.90)

## 2015-11-19 LAB — BASIC METABOLIC PANEL
BUN: 9 mg/dL (ref 4–21)
CREATININE: 0.6 mg/dL (ref 0.6–1.3)
Glucose: 91 mg/dL
Potassium: 2.6 mmol/L — AB (ref 3.4–5.3)
Sodium: 140 mmol/L (ref 137–147)

## 2015-11-19 LAB — CBC AND DIFFERENTIAL
HEMATOCRIT: 37 % — AB (ref 41–53)
HEMOGLOBIN: 12 g/dL — AB (ref 13.5–17.5)
NEUTROS ABS: 2 /uL
PLATELETS: 230 10*3/uL (ref 150–399)
WBC: 3.9 10*3/mL

## 2015-11-19 LAB — LIPID PANEL
CHOLESTEROL: 110 mg/dL (ref 0–200)
HDL: 30 mg/dL — AB (ref 35–70)
LDL Cholesterol: 51 mg/dL
TRIGLYCERIDES: 145 mg/dL (ref 40–160)

## 2015-11-19 LAB — HEPATIC FUNCTION PANEL
ALK PHOS: 75 U/L (ref 25–125)
ALT: 17 U/L (ref 10–40)
AST: 36 U/L (ref 14–40)
Bilirubin, Total: 1.2 mg/dL

## 2015-11-20 ENCOUNTER — Encounter (HOSPITAL_COMMUNITY): Payer: Self-pay | Admitting: Emergency Medicine

## 2015-11-20 ENCOUNTER — Emergency Department (HOSPITAL_COMMUNITY)
Admission: EM | Admit: 2015-11-20 | Discharge: 2015-11-20 | Disposition: A | Discharge status: Home / Self Care | Payer: Medicare Other | Attending: Emergency Medicine | Admitting: Emergency Medicine

## 2015-11-20 DIAGNOSIS — E876 Hypokalemia: Secondary | ICD-10-CM

## 2015-11-20 DIAGNOSIS — Z8673 Personal history of transient ischemic attack (TIA), and cerebral infarction without residual deficits: Secondary | ICD-10-CM | POA: Insufficient documentation

## 2015-11-20 DIAGNOSIS — G40909 Epilepsy, unspecified, not intractable, without status epilepticus: Secondary | ICD-10-CM | POA: Insufficient documentation

## 2015-11-20 DIAGNOSIS — Z87891 Personal history of nicotine dependence: Secondary | ICD-10-CM | POA: Insufficient documentation

## 2015-11-20 DIAGNOSIS — Z79899 Other long term (current) drug therapy: Secondary | ICD-10-CM | POA: Insufficient documentation

## 2015-11-20 DIAGNOSIS — R7989 Other specified abnormal findings of blood chemistry: Secondary | ICD-10-CM | POA: Diagnosis present

## 2015-11-20 LAB — I-STAT CHEM 8, ED
BUN: 9 mg/dL (ref 6–20)
BUN: 8 mg/dL (ref 6–20)
CALCIUM ION: 1.06 mmol/L — AB (ref 1.12–1.23)
CHLORIDE: 103 mmol/L (ref 101–111)
CHLORIDE: 104 mmol/L (ref 101–111)
Calcium, Ion: 1.06 mmol/L — ABNORMAL LOW (ref 1.12–1.23)
Creatinine, Ser: 0.6 mg/dL — ABNORMAL LOW (ref 0.61–1.24)
Creatinine, Ser: 0.5 mg/dL — ABNORMAL LOW (ref 0.61–1.24)
Glucose, Bld: 94 mg/dL (ref 65–99)
Glucose, Bld: 93 mg/dL (ref 65–99)
HEMATOCRIT: 32 % — AB (ref 39.0–52.0)
HEMATOCRIT: 33 % — AB (ref 39.0–52.0)
HEMOGLOBIN: 10.9 g/dL — AB (ref 13.0–17.0)
Hemoglobin: 11.2 g/dL — ABNORMAL LOW (ref 13.0–17.0)
POTASSIUM: 2.3 mmol/L — AB (ref 3.5–5.1)
Potassium: 3 mmol/L — ABNORMAL LOW (ref 3.5–5.1)
SODIUM: 140 mmol/L (ref 135–145)
SODIUM: 138 mmol/L (ref 135–145)
TCO2: 23 mmol/L (ref 0–100)
TCO2: 21 mmol/L (ref 0–100)

## 2015-11-20 LAB — BASIC METABOLIC PANEL
Anion gap: 6 (ref 5–15)
BUN: 12 mg/dL (ref 6–20)
CHLORIDE: 106 mmol/L (ref 101–111)
CO2: 24 mmol/L (ref 22–32)
Calcium: 7.7 mg/dL — ABNORMAL LOW (ref 8.9–10.3)
Creatinine, Ser: 0.56 mg/dL — ABNORMAL LOW (ref 0.61–1.24)
GFR calc Af Amer: >60 mL/min (ref >60)
GFR calc non Af Amer: >60 mL/min (ref >60)
Glucose, Bld: 101 mg/dL — ABNORMAL HIGH (ref 65–99)
POTASSIUM: 2.2 mmol/L — AB (ref 3.5–5.1)
SODIUM: 136 mmol/L (ref 135–145)

## 2015-11-20 LAB — CBC WITH DIFFERENTIAL/PLATELET
BASOS ABS: 0 10*3/uL (ref 0.0–0.1)
BASOS PCT: 1 %
EOS ABS: 0.1 10*3/uL (ref 0.0–0.7)
Eosinophils Relative: 3 %
HCT: 31.1 % — ABNORMAL LOW (ref 39.0–52.0)
HEMOGLOBIN: 10.8 g/dL — AB (ref 13.0–17.0)
Lymphocytes Relative: 44 %
Lymphs Abs: 1.9 10*3/uL (ref 0.7–4.0)
MCH: 32.9 pg (ref 26.0–34.0)
MCHC: 34.7 g/dL (ref 30.0–36.0)
MCV: 94.8 fL (ref 78.0–100.0)
Monocytes Absolute: 0.5 10*3/uL (ref 0.1–1.0)
Monocytes Relative: 11 %
NEUTROS PCT: 41 %
Neutro Abs: 1.8 10*3/uL (ref 1.7–7.7)
Platelets: 193 10*3/uL (ref 150–400)
RBC: 3.28 MIL/uL — AB (ref 4.22–5.81)
RDW: 16.1 % — ABNORMAL HIGH (ref 11.5–15.5)
WBC: 4.3 10*3/uL (ref 4.0–10.5)

## 2015-11-20 MED ORDER — POTASSIUM CHLORIDE CRYS ER 20 MEQ PO TBCR
60.0000 meq | EXTENDED_RELEASE_TABLET | Freq: Once | ORAL | Status: AC
  Administered 2015-11-20: 60 meq via ORAL
  Filled 2015-11-20: qty 3

## 2015-11-20 MED ORDER — POTASSIUM CHLORIDE 10 MEQ/100ML IV SOLN
10.0000 meq | INTRAVENOUS | Status: AC
  Administered 2015-11-20 (×2): 10 meq via INTRAVENOUS
  Filled 2015-11-20 (×2): qty 100

## 2015-11-20 NOTE — ED Notes (Signed)
[facility] contacted for transport 

## 2015-11-20 NOTE — ED Notes (Signed)
Pt refused getting the EKG stating that "there is nothing wrong with my heart".

## 2015-11-20 NOTE — ED Notes (Signed)
MD at bedside. 

## 2015-11-20 NOTE — ED Notes (Signed)
[facility] Expected date:  Expected time:  Means of arrival:  Comments: 61 yr low potassium

## 2015-11-20 NOTE — ED Provider Notes (Signed)
CSN: [id number]     Arrival date & time 11/20/15  0108 History  By signing my name below, I, Vista Mink, attest that this documentation has been prepared under the direction and in the presence of Tomasita Crumble, MD. Electronically signed, Vista Mink, ED Scribe. 11/20/2015. 1:49 AM.     Chief Complaint  Patient presents with  . Abnormal Lab   The history is provided by the patient. No language interpreter was used.    HPI Comments: Randall Hoffman is a 81 y.o. male brought in by ambulance, who presents to the Emergency Department due to low potassium level of 2.6 after lab results taken by [facility]. Per EMS, they are unsure when the facility became aware of lab results but patient states he has blood drawn today. Pt states that he has had diarrhea for the past three days. Pt also reports that he occasionally becomes dizzy. Pt denies any chest pain, shortness of breath, or vomiting.  Past Medical History  Diagnosis Date  . Stroke (HCC)   . Seizure disorder (HCC)   . Hepatitis C    Past Surgical History  Procedure Laterality Date  . Total hip arthroplasty Left 10/11/2015    Procedure: TOTAL HIP ARTHROPLASTY ANTERIOR APPROACH;  Surgeon: Kathryne Hitch, MD;  Location: [facility] OR;  Service: Orthopedics;  Laterality: Left;  . Total hip revision Left 10/13/2015    Procedure:   REVISION OF LEFT HIP BALL;  Surgeon: Kathryne Hitch, MD;  Location: [facility] OR;  Service: Orthopedics;  Laterality: Left;   Family History  Problem Relation Age of Onset  . Heart disease Other     unknown, patient says family members died long ago and he cannot remember   Social History  Substance Use Topics  . Smoking status: Former Games developer  . Smokeless tobacco: Not on file  . Alcohol Use: No    Review of Systems  A complete 10 system review of systems was obtained and all systems are negative except as noted in the HPI and PMH.    Allergies  Penicillins  Home Medications   Prior to  Admission medications   Medication Sig Start Date End Date Taking? Authorizing Provider  acetaminophen (TYLENOL) 500 MG tablet Take 1,000 mg by mouth 2 (two) times daily.    Historical Provider, MD  alendronate (FOSAMAX) 70 MG tablet Take 1 tablet (70 mg total) by mouth once a week. Take with a full glass of water on an empty stomach. 10/13/15   Lora Paula, MD  atorvastatin (LIPITOR) 10 MG tablet Take 10 mg by mouth daily.    Historical Provider, MD  citalopram (CELEXA) 10 MG tablet Take 10 mg by mouth daily.    Historical Provider, MD  docusate sodium (COLACE) 100 MG capsule Take 100 mg by mouth daily.    Historical Provider, MD  ferrous sulfate 325 (65 FE) MG tablet Take 325 mg by mouth daily with breakfast.    Historical Provider, MD  HEPARIN & NACL LOCK FLUSH IV Inject 5 mLs into the vein every 8 (eight) hours. Stop date 11/18/15    Historical Provider, MD  HYDROcodone-acetaminophen (NORCO/VICODIN) 5-325 MG tablet Take 1-2 tablets by mouth every 6 (six) hours as needed for moderate pain. 11/02/15   Kirt Boys, DO  MEROPENEM IV Inject 1 g into the vein every 8 (eight) hours. Stop date 11/16/15    Historical Provider, MD  methocarbamol (ROBAXIN) 500 MG tablet Take 1 tablet (500 mg total) by mouth every  6 (six) hours as needed for muscle spasms. 10/13/15   Lora PaulaJennifer T Krall, MD  pantoprazole (PROTONIX) 40 MG tablet Take 40 mg by mouth daily.    Historical Provider, MD  sodium chloride 0.9 % injection Inject 10 mLs into the vein every 8 (eight) hours. Stop date 11/18/15    Historical Provider, MD  tamsulosin (FLOMAX) 0.4 MG CAPS capsule Take 0.4 mg by mouth at bedtime.    Historical Provider, MD  topiramate (TOPAMAX) 50 MG tablet Take 50 mg by mouth 2 (two) times daily.    Historical Provider, MD  UNABLE TO FIND Med Name: Med pass 120 mL by mouth daily    Historical Provider, MD   BP 118/78 mmHg  Pulse 108  Temp(Src) 98.3 F (36.8 C) (Oral)  Resp 16  SpO2 100% Physical Exam  Constitutional:  He is oriented to person, place, and time. Vital signs are normal. He appears well-developed and well-nourished.  Non-toxic appearance. He does not appear ill. No distress.  HENT:  Head: Normocephalic and atraumatic.  Nose: Nose normal.  Mouth/Throat: Oropharynx is clear and moist. No oropharyngeal exudate.  Eyes: Conjunctivae and EOM are normal. Pupils are equal, round, and reactive to light. No scleral icterus.  Neck: Normal range of motion. Neck supple. No tracheal deviation, no edema, no erythema and normal range of motion present. No thyroid mass and no thyromegaly present.  Cardiovascular: Normal rate, regular rhythm, S1 normal, S2 normal, normal heart sounds, intact distal pulses and normal pulses.  Exam reveals no gallop and no friction rub.   No murmur heard. Pulmonary/Chest: Effort normal and breath sounds normal. No respiratory distress. He has no wheezes. He has no rhonchi. He has no rales.  Abdominal: Soft. Normal appearance and bowel sounds are normal. He exhibits no distension, no ascites and no mass. There is no hepatosplenomegaly. There is no tenderness. There is no rebound, no guarding and no CVA tenderness.  Musculoskeletal: Normal range of motion. He exhibits edema. He exhibits no tenderness.  Left upper extremity picc line Bilateral lower extremity edema  Lymphadenopathy:    He has no cervical adenopathy.  Neurological: He is alert and oriented to person, place, and time. He has normal strength. No cranial nerve deficit or sensory deficit.  Skin: Skin is warm, dry and intact. No petechiae and no rash noted. He is not diaphoretic. No erythema. No pallor.  Psychiatric: He has a normal mood and affect. His behavior is normal. Judgment normal.  Nursing note and vitals reviewed.   ED Course  Procedures  DIAGNOSTIC STUDIES: Oxygen Saturation is 100% on RA, normal by my interpretation.  COORDINATION OF CARE: 1:45 AM-Will order blood work and ekg. Discussed treatment plan  with pt at bedside and pt agreed to plan.   Labs Review  Labs Reviewed  CBC WITH DIFFERENTIAL/PLATELET - Abnormal; Notable for the following:    RBC 3.28 (*)    Hemoglobin 10.8 (*)    HCT 31.1 (*)    RDW 16.1 (*)    All other components within normal limits  BASIC METABOLIC PANEL - Abnormal; Notable for the following:    Potassium 2.2 (*)    Glucose, Bld 101 (*)    Creatinine, Ser 0.56 (*)    Calcium 7.7 (*)    All other components within normal limits  I-STAT CHEM 8, ED - Abnormal; Notable for the following:    Potassium 2.3 (*)    Creatinine, Ser 0.60 (*)    Calcium, Ion 1.06 (*)  Hemoglobin 10.9 (*)    HCT 32.0 (*)    All other components within normal limits  I-STAT CHEM 8, ED - Abnormal; Notable for the following:    Potassium 3.0 (*)    Creatinine, Ser 0.50 (*)    Calcium, Ion 1.06 (*)    Hemoglobin 11.2 (*)    HCT 33.0 (*)    All other components within normal limits    Imaging Review No results found. I have personally reviewed and evaluated these images and lab results as part of my medical decision-making.   EKG Interpretation None      MDM   Final diagnoses:  None    Patient presents to the ED for low potassium.  He is completely asymptomatic and refusing EKG.  Will recheck potassium level and give him replacement if needed.  6:05 AM Repeat potassium is 3.0 after replacement.  Patient encouraged to maintain normal diet and have his potassium checked again in 2 days.  He appears well and in NAD. Vs remain within his normal limits and he is safe for DC.   I personally performed the services described in this documentation, which was scribed in my presence. The recorded information has been reviewed and is accurate.      Tomasita CrumbleAdeleke Joanthan Hlavacek, MD 11/20/15 [phone]

## 2015-11-20 NOTE — ED Notes (Signed)
[facility] due to potassium level of 2.6. Patient is currently a rehab patient for a recent left hip replacement. EMS in unsure when facility was aware of lab results, but EMS call was made at [phone]. Patients daughter is POA, facility made several attempts to contact her and left a message.

## 2015-11-20 NOTE — Discharge Instructions (Signed)
Hypokalemia Randall Hoffman, YOU NEED TO HAVE YOUR POTASSIUM LEVEL CHECKED IN 2 DAYS.  Your potassium was replaced and is now 3.0.  Please see a primary care doctor to have this done.  Be sure to eat and drink normal amounts of food daily.  If any symptoms worsen, come back to the ED immediately. Thank you. Hypokalemia means that the amount of potassium in the blood is lower than normal.Potassium is a chemical, called an electrolyte, that helps regulate the amount of fluid in the body. It also stimulates muscle contraction and helps nerves function properly.Most of the body's potassium is inside of cells, and only a very small amount is in the blood. Because the amount in the blood is so small, minor changes can be life-threatening. CAUSES  Antibiotics.  Diarrhea or vomiting.  Using laxatives too much, which can cause diarrhea.  Chronic kidney disease.  Water pills (diuretics).  Eating disorders (bulimia).  Low magnesium level.  Sweating a lot. SIGNS AND SYMPTOMS  Weakness.  Constipation.  Fatigue.  Muscle cramps.  Mental confusion.  Skipped heartbeats or irregular heartbeat (palpitations).  Tingling or numbness. DIAGNOSIS  Your health care provider can diagnose hypokalemia with blood tests. In addition to checking your potassium level, your health care provider may also check other lab tests. TREATMENT Hypokalemia can be treated with potassium supplements taken by mouth or adjustments in your current medicines. If your potassium level is very low, you may need to get potassium through a vein (IV) and be monitored in the hospital. A diet high in potassium is also helpful. Foods high in potassium are:  Nuts, such as peanuts and pistachios.  Seeds, such as sunflower seeds and pumpkin seeds.  Peas, lentils, and lima beans.  Whole grain and bran cereals and breads.  Fresh fruit and vegetables, such as apricots, avocado, bananas, cantaloupe, kiwi, oranges, tomatoes,  asparagus, and potatoes.  Orange and tomato juices.  Red meats.  Fruit yogurt. HOME CARE INSTRUCTIONS  Take all medicines as prescribed by your health care provider.  Maintain a healthy diet by including nutritious food, such as fruits, vegetables, nuts, whole grains, and lean meats.  If you are taking a laxative, be sure to follow the directions on the label. SEEK MEDICAL CARE IF:  Your weakness gets worse.  You feel your heart pounding or racing.  You are vomiting or having diarrhea.  You are diabetic and having trouble keeping your blood glucose in the normal range. SEEK IMMEDIATE MEDICAL CARE IF:  You have chest pain, shortness of breath, or dizziness.  You are vomiting or having diarrhea for more than 2 days.  You faint. MAKE SURE YOU:   Understand these instructions.  Will watch your condition.  Will get help right away if you are not doing well or get worse.   This information is not intended to replace advice given to you by your health care provider. Make sure you discuss any questions you have with your health care provider.   Document Released: 05/12/2005 Document Revised: 06/02/2014 Document Reviewed: 11/12/2012 Elsevier Interactive Patient Education [employer]2016 Elsevier Inc.

## 2015-11-20 NOTE — ED Notes (Signed)
Pt potassium was 2.3 EDP Oni made aware.

## 2015-12-10 LAB — TSH: TSH: 14.83 u[IU]/mL — AB (ref 0.41–5.90)

## 2015-12-11 ENCOUNTER — Encounter: Payer: Self-pay | Admitting: Adult Health

## 2015-12-11 ENCOUNTER — Non-Acute Institutional Stay (SKILLED_NURSING_FACILITY): Payer: Medicare Other | Admitting: Adult Health

## 2015-12-11 DIAGNOSIS — E46 Unspecified protein-calorie malnutrition: Secondary | ICD-10-CM | POA: Diagnosis not present

## 2015-12-11 DIAGNOSIS — N400 Enlarged prostate without lower urinary tract symptoms: Secondary | ICD-10-CM | POA: Diagnosis not present

## 2015-12-11 DIAGNOSIS — E785 Hyperlipidemia, unspecified: Secondary | ICD-10-CM

## 2015-12-11 DIAGNOSIS — M810 Age-related osteoporosis without current pathological fracture: Secondary | ICD-10-CM | POA: Diagnosis not present

## 2015-12-11 DIAGNOSIS — Z8673 Personal history of transient ischemic attack (TIA), and cerebral infarction without residual deficits: Secondary | ICD-10-CM | POA: Diagnosis not present

## 2015-12-11 DIAGNOSIS — F329 Major depressive disorder, single episode, unspecified: Secondary | ICD-10-CM | POA: Diagnosis not present

## 2015-12-11 DIAGNOSIS — K5900 Constipation, unspecified: Secondary | ICD-10-CM

## 2015-12-11 DIAGNOSIS — G40909 Epilepsy, unspecified, not intractable, without status epilepticus: Secondary | ICD-10-CM | POA: Diagnosis not present

## 2015-12-11 DIAGNOSIS — R946 Abnormal results of thyroid function studies: Secondary | ICD-10-CM

## 2015-12-11 DIAGNOSIS — K219 Gastro-esophageal reflux disease without esophagitis: Secondary | ICD-10-CM

## 2015-12-11 DIAGNOSIS — R7989 Other specified abnormal findings of blood chemistry: Secondary | ICD-10-CM

## 2015-12-11 DIAGNOSIS — S7292XS Unspecified fracture of left femur, sequela: Secondary | ICD-10-CM

## 2015-12-11 DIAGNOSIS — E876 Hypokalemia: Secondary | ICD-10-CM

## 2015-12-11 DIAGNOSIS — F32A Depression, unspecified: Secondary | ICD-10-CM

## 2015-12-11 DIAGNOSIS — R531 Weakness: Secondary | ICD-10-CM | POA: Diagnosis not present

## 2015-12-11 DIAGNOSIS — D62 Acute posthemorrhagic anemia: Secondary | ICD-10-CM | POA: Diagnosis not present

## 2015-12-11 NOTE — Progress Notes (Signed)
Patient ID: Randall Hoffman, male   DOB: 05-20-1935, 81 y.o.   MRN: [id number]    DATE:  12/11/2015   MRN:  [id number]  BIRTHDAY: 05-20-1935  Facility:  Nursing Home Location:  [facility]  Nursing Home Room Number: [id number]  LEVEL OF CARE:  SNF (31)  Contact Information    Name Relation Home Work Mobile   Oak RunRomano,Donna Daughter [phone]     Sheppard Coilngarola,Mary Spouse [phone]         Code Status History    Date Active Date Inactive Code Status Order ID Comments User Context   10/11/2015  9:22 AM 10/16/2015  8:53 PM DNR [id number]  Yolanda MangesAlex M Wilson, DO ED    Questions for Most Recent Historical Code Status (Order [id number])    Question Answer Comment   In the event of cardiac or respiratory ARREST Do not call a "code blue"    In the event of cardiac or respiratory ARREST Do not perform Intubation, CPR, defibrillation or ACLS    In the event of cardiac or respiratory ARREST Use medication by any route, position, wound care, and other measures to relive pain and suffering. May use oxygen, suction and manual treatment of airway obstruction as needed for comfort.     Advance Directive Documentation        Most Recent Value   Type of Advance Directive  Out of facility DNR (pink MOST or yellow form)   Pre-existing out of facility DNR order (yellow form or pink MOST form)     "MOST" Form in Place?         Chief Complaint  Patient presents with  . Medical Management of Chronic Issues    HISTORY OF PRESENT ILLNESS:  This is a 81 year old male who is being seen for a routine visit. He [facility]. He had left displaced femoral neck fracture S/P left total hip arthroplasty on 10/11/15. He recently finished antibiotic for ESBL UTI. PICC line was discontinued. Remeron was recently added for his depression  and continues to take Citalopram. He has been resistive to care. He has been telling staff to get out of his room. He has told me to "get out and  practice on someone else." Latest tsh is 14.83. The last tsh done on 11/19/15 is 23.47. He is not taking any medication for thyroid disease. He has BMI 19.2  . He was recently sent to ED for hypokalemia.  He is currently having a short-term rehabilitation.   PAST MEDICAL HISTORY:  Past Medical History  Diagnosis Date  . Stroke (HCC)   . Seizure disorder (HCC)   . Hepatitis C   . Hyponatremia   . HLD (hyperlipidemia)   . ESBL (extended spectrum beta-lactamase) producing bacteria infection   . Protein calorie malnutrition (HCC)   . GERD (gastroesophageal reflux disease)   . History of CVA (cerebrovascular accident)   . Acute blood loss anemia   . Osteoporosis   . Chronic depression   . Constipation   . BPH (benign prostatic hyperplasia)   . Femur fracture, left (HCC)      CURRENT MEDICATIONS: Reviewed  Patient's Medications  New Prescriptions   No medications on file  Previous Medications   ACETAMINOPHEN (TYLENOL) 500 MG TABLET    Take 1,000 mg by mouth 2 (two) times daily.   ALENDRONATE (FOSAMAX) 70 MG TABLET    Take 1 tablet (70 mg total) by mouth once a week. Take with a full glass  of water on an empty stomach.   ATORVASTATIN (LIPITOR) 10 MG TABLET    Take 10 mg by mouth daily.    BACLOFEN (LIORESAL) 10 MG TABLET    Take 10 mg by mouth 3 (three) times daily.   CITALOPRAM (CELEXA) 10 MG TABLET    Take 10 mg by mouth daily.   DOCUSATE SODIUM (COLACE) 100 MG CAPSULE    Take 100 mg by mouth daily.   FERROUS SULFATE 325 (65 FE) MG TABLET    Take 325 mg by mouth daily with breakfast.   HYDROCERIN (EUCERIN) CREA    Apply 1 application topically daily. To bilateral legs and feet   HYDROCODONE-ACETAMINOPHEN (NORCO/VICODIN) 5-325 MG TABLET    Take 1-2 tablets by mouth every 6 (six) hours as needed for moderate pain.   METHOCARBAMOL (ROBAXIN) 500 MG TABLET    Take 1 tablet (500 mg total) by mouth every 6 (six) hours as needed for muscle spasms.   MIRTAZAPINE (REMERON) 15 MG TABLET     Take 15 mg by mouth at bedtime.   PANTOPRAZOLE (PROTONIX) 40 MG TABLET    Take 40 mg by mouth daily.   TAMSULOSIN (FLOMAX) 0.4 MG CAPS CAPSULE    Take 0.4 mg by mouth at bedtime.    TOPIRAMATE (TOPAMAX) 50 MG TABLET    Take 50 mg by mouth 2 (two) times daily.   UNABLE TO FIND    Med Name: Med pass 120 mL by mouth TID  Modified Medications   No medications on file  Discontinued Medications   LOPERAMIDE (IMODIUM) 2 MG CAPSULE    Take 4 mg by mouth as needed for diarrhea or loose stools.     Allergies  Allergen Reactions  . Penicillins Rash    Note patient had ancef on 10/11/15 with no reported complications     REVIEW OF SYSTEMS:  Unable to obtain since he has refused to answer queries.   PHYSICAL EXAMINATION  GENERAL APPEARANCE:  In no acute distress.  SKIN:  Skin is warm and dry.  HEAD: Normal in size and contour. No evidence of trauma EYES: Lids open and close normally. No blepharitis, entropion or ectropion. PERRL. Conjunctivae are clear and sclerae are white. Lenses are without opacity EARS: Pinnae are normal. Patient hears normal voice tunes of the examiner MOUTH and THROAT: Lips are without lesions. Oral mucosa is moist and without lesions. Tongue is normal in shape, size, and color and without lesions NECK: supple, trachea midline, no neck masses, no thyroid tenderness, no thyromegaly LYMPHATICS: no LAN in the neck, no supraclavicular LAN RESPIRATORY: breathing is even & unlabored, BS CTAB CARDIAC: RRR, no murmur,no extra heart sounds, no edema GI: abdomen soft, normal BS, no masses, no tenderness, no hepatomegaly, no splenomegaly EXTREMITIES:  Able to move X 4 extremities, generalized weakness  PSYCHIATRIC: Alert and oriented to person. Agitated   LABS/RADIOLOGY: Labs reviewed: Basic Metabolic Panel:  Recent Labs  47/82/95 0427  10/15/15 0304  11/20/15 0219 11/20/15 0228 11/20/15 0559  NA 129*  < > 130*  < > 136 140 138  K 3.4*  --  3.8  < > 2.2* 2.3* 3.0*   CL 99*  --  101  --  106 103 104  CO2 24  --  22  --  24  --   --   GLUCOSE 110*  --  90  --  101* 94 93  BUN 12  < > 11  < > 12 9 8   CREATININE 0.97  < >  0.90  < > 0.56* 0.60* 0.50*  CALCIUM 7.9*  --  8.1*  --  7.7*  --   --   MG  --   --  1.9  --   --   --   --   < > = values in this interval not displayed. Liver Function Tests:  Recent Labs  10/11/15 0950 11/19/15  AST 53* 36  ALT 42 17  ALKPHOS 76 75  BILITOT 1.3*  --   PROT 6.7  --   ALBUMIN 3.1*  --     CBC:  Recent Labs  10/11/15 0541  10/14/15 0427  10/15/15 0304 11/19/15 11/20/15 0219 11/20/15 0228 11/20/15 0559  WBC 5.3  < > 5.1  < > 4.9 3.9 4.3  --   --   NEUTROABS 2.0  --   --   --   --  2 1.8  --   --   HGB 12.9*  < > 7.9*  --  8.3* 12.0* 10.8* 10.9* 11.2*  HCT 37.6*  < > 23.1*  --  24.3* 37* 31.1* 32.0* 33.0*  MCV 94.2  < > 93.1  --  92.4  --  94.8  --   --   PLT 150  < > 153  --  192 230 193  --   --   < > = values in this interval not displayed.  Lipid Panel:  Recent Labs  11/19/15  HDL 30*      ASSESSMENT/PLAN:  Generalized weakness - continue rehabilitation, PT and OT  Hypokalemia - was recently sent to the hospital and supplemented with K; check BMP  Elevated TSH - check free T4 and T3  Seizure disorder - continue topiramate 50 mg 1 tab by mouth twice a day  History of CVA - late this hemoglobin 12.0; start aspirin EC 81 mg 1 tab by mouth daily; CBC in 1 week  GERD - continue Protonix 40 mg 1 tab by mouth daily  Left femoral fracture S/P arthroplasty - continue rehabilitation; she was recently started on baclofen 10 mg 1 tab by mouth every 8 hours and Robaxin 500 mg 1 tab by mouth every 6 hours when necessary for muscle spasm; continue acetaminophen 500 mg take 2 tabs = 1000 mg by mouth twice a day and Norco 5/325 mg 1-2 tablets by mouth every 6 hours when necessary for pain  Depression - continue citalopram 10 mg 1 tab by mouth daily and Remeron 15 mg 1 tab by mouth daily at  bedtime  Anemia, acute blood loss - hgb 12.0 ;continue ferrous sulfate 325 mg 1 tab by mouth daily  BPH - continue Flomax 0.4 mg 1 capsule by mouth daily  Constipation - continue Colace 100 mg 1 capsule by mouth daily  Hyperlipidemia - continue Lipitor 10 mg 1 tab by mouth daily Lab Results  Component Value Date   CHOL 110 11/19/2015   HDL 30* 11/19/2015   LDLCALC 51 11/19/2015   TRIG 145 11/19/2015   Osteoporosis - continue Fosamax 70 mg 1 tab by mouth every week  Protein calorie malnutrition - continue Med pass 120 ml TID; RD consult    Goals of care:  Long-term care    Kenard Gower, NP [facility] [phone]

## 2015-12-12 ENCOUNTER — Non-Acute Institutional Stay (SKILLED_NURSING_FACILITY): Payer: Medicare Other | Admitting: Adult Health

## 2015-12-12 ENCOUNTER — Encounter: Payer: Self-pay | Admitting: Adult Health

## 2015-12-12 DIAGNOSIS — E038 Other specified hypothyroidism: Secondary | ICD-10-CM | POA: Diagnosis not present

## 2015-12-12 DIAGNOSIS — E876 Hypokalemia: Secondary | ICD-10-CM

## 2015-12-12 DIAGNOSIS — E039 Hypothyroidism, unspecified: Secondary | ICD-10-CM

## 2015-12-12 LAB — BASIC METABOLIC PANEL
BUN: 13 mg/dL (ref 4–21)
Creatinine: 0.7 mg/dL (ref 0.6–1.3)
GLUCOSE: 100 mg/dL
Potassium: 2.8 mmol/L — AB (ref 3.4–5.3)
Sodium: 142 mmol/L (ref 137–147)

## 2015-12-12 NOTE — Progress Notes (Signed)
Patient ID: Randall Hoffman, male   DOB: [birth year], 81 y.o.   MRN: [id number]    DATE:  12/12/2015   MRN:  [id number]  BIRTHDAY: [birth year]  Facility:  Nursing Home Location:  [facility]  Nursing Home Room Number: 407-P  LEVEL OF CARE:  SNF (31)  Contact Information    Name Relation Home Work Mobile   Adams RunRomano,Donna Daughter [phone]     Sheppard Coilngarola,Mary Spouse [phone]         Code Status History    Date Active Date Inactive Code Status Order ID Comments User Context   10/11/2015  9:22 AM 10/16/2015  8:53 PM DNR [id number]  Yolanda MangesAlex M Wilson, DO ED    Questions for Most Recent Historical Code Status (Order [id number])    Question Answer Comment   In the event of cardiac or respiratory ARREST Do not call a "code blue"    In the event of cardiac or respiratory ARREST Do not perform Intubation, CPR, defibrillation or ACLS    In the event of cardiac or respiratory ARREST Use medication by any route, position, wound care, and other measures to relive pain and suffering. May use oxygen, suction and manual treatment of airway obstruction as needed for comfort.     Advance Directive Documentation        Most Recent Value   Type of Advance Directive  Out of facility DNR (pink MOST or yellow form)   Pre-existing out of facility DNR order (yellow form or pink MOST form)     "MOST" Form in Place?         Chief Complaint  Patient presents with  . Acute Visit    Hypokalemia    HISTORY OF PRESENT ILLNESS:  This is a 81 year old male who was noted to have K 2.8, low. He was seen having therapy room. Therapist will bring patient back to bed to rest and be able to take his medicine. Free T4 2.60 (WNL), free T4 0.90 (WNL) and tsh 14.83.   PAST MEDICAL HISTORY:  Past Medical History  Diagnosis Date  . Stroke (HCC)   . Seizure disorder (HCC)   . Hepatitis C   . Hyponatremia   . HLD (hyperlipidemia)   . ESBL (extended spectrum beta-lactamase) producing bacteria  infection   . Protein calorie malnutrition (HCC)   . GERD (gastroesophageal reflux disease)   . History of CVA (cerebrovascular accident)   . Acute blood loss anemia   . Osteoporosis   . Chronic depression   . Constipation   . BPH (benign prostatic hyperplasia)   . Femur fracture, left (HCC)      CURRENT MEDICATIONS: Reviewed  Patient's Medications  New Prescriptions   No medications on file  Previous Medications   ACETAMINOPHEN (TYLENOL) 500 MG TABLET    Take 1,000 mg by mouth 2 (two) times daily.   ALENDRONATE (FOSAMAX) 70 MG TABLET    Take 1 tablet (70 mg total) by mouth once a week. Take with a full glass of water on an empty stomach.   ASPIRIN EC 81 MG TABLET    Take 81 mg by mouth daily.   ATORVASTATIN (LIPITOR) 10 MG TABLET    Take 10 mg by mouth daily.    BACLOFEN (LIORESAL) 10 MG TABLET    Take 10 mg by mouth 3 (three) times daily.   CITALOPRAM (CELEXA) 10 MG TABLET    Take 10 mg by mouth daily.   DOCUSATE SODIUM (COLACE) 100 MG CAPSULE  Take 100 mg by mouth daily.   FERROUS SULFATE 325 (65 FE) MG TABLET    Take 325 mg by mouth daily with breakfast.    HYDROCERIN (EUCERIN) CREA    Apply 1 application topically daily. To bilateral legs and feet   HYDROCODONE-ACETAMINOPHEN (NORCO/VICODIN) 5-325 MG TABLET    Take 1-2 tablets by mouth every 6 (six) hours as needed for moderate pain.   METHOCARBAMOL (ROBAXIN) 500 MG TABLET    Take 1 tablet (500 mg total) by mouth every 6 (six) hours as needed for muscle spasms.   MIRTAZAPINE (REMERON) 15 MG TABLET    Take 15 mg by mouth at bedtime.   PANTOPRAZOLE (PROTONIX) 40 MG TABLET    Take 40 mg by mouth daily.   POTASSIUM CHLORIDE SA (K-DUR,KLOR-CON) 20 MEQ TABLET    Take 40 mEq by mouth. Take 40 mEq TID x1 day, then 40 mEq BID   TAMSULOSIN (FLOMAX) 0.4 MG CAPS CAPSULE    Take 0.4 mg by mouth at bedtime.    TOPIRAMATE (TOPAMAX) 50 MG TABLET    Take 50 mg by mouth 2 (two) times daily.   UNABLE TO FIND    Med Name: Med pass 120 mL by mouth  TID  Modified Medications   No medications on file  Discontinued Medications   No medications on file     Allergies  Allergen Reactions  . Penicillins Rash    Note patient had ancef on 10/11/15 with no reported complications     REVIEW OF SYSTEMS:  Unable to obtain since he has refused to answer queries.   PHYSICAL EXAMINATION  GENERAL APPEARANCE:  In no acute distress.  SKIN:  Skin is warm and dry.  HEAD: Normal in size and contour. No evidence of trauma EYES: Lids open and close normally. No blepharitis, entropion or ectropion. PERRL. Conjunctivae are clear and sclerae are white. Lenses are without opacity EARS: Pinnae are normal. Patient hears normal voice tunes of the examiner MOUTH and THROAT: Lips are without lesions. Oral mucosa is moist and without lesions. Tongue is normal in shape, size, and color and without lesions NECK: supple, trachea midline, no neck masses, no thyroid tenderness, no thyromegaly LYMPHATICS: no LAN in the neck, no supraclavicular LAN RESPIRATORY: breathing is even & unlabored, BS CTAB CARDIAC: RRR, no murmur,no extra heart sounds, no edema GI: abdomen soft, normal BS, no masses, no tenderness, no hepatomegaly, no splenomegaly EXTREMITIES:  Able to move X 4 extremities, generalized weakness  PSYCHIATRIC: Alert and oriented to person. Agitated   LABS/RADIOLOGY: Labs reviewed: Basic Metabolic Panel:  Recent Labs  84/13/24 0427  10/15/15 0304  11/20/15 0219 11/20/15 0228 11/20/15 0559 12/12/15  NA 129*  < > 130*  < > 136 140 138 142  K 3.4*  --  3.8  < > 2.2* 2.3* 3.0* 2.8*  CL 99*  --  101  --  106 103 104  --   CO2 24  --  22  --  24  --   --   --   GLUCOSE 110*  --  90  --  101* 94 93  --   BUN 12  < > 11  < > 12 9 8 13   CREATININE 0.97  < > 0.90  < > 0.56* 0.60* 0.50* 0.7  CALCIUM 7.9*  --  8.1*  --  7.7*  --   --   --   MG  --   --  1.9  --   --   --   --   --   < > =  values in this interval not displayed. Liver Function  Tests:  Recent Labs  10/11/15 0950 11/19/15  AST 53* 36  ALT 42 17  ALKPHOS 76 75  BILITOT 1.3*  --   PROT 6.7  --   ALBUMIN 3.1*  --     CBC:  Recent Labs  10/11/15 0541  10/14/15 0427  10/15/15 0304 11/19/15 11/20/15 0219 11/20/15 0228 11/20/15 0559  WBC 5.3  < > 5.1  < > 4.9 3.9 4.3  --   --   NEUTROABS 2.0  --   --   --   --  2 1.8  --   --   HGB 12.9*  < > 7.9*  --  8.3* 12.0* 10.8* 10.9* 11.2*  HCT 37.6*  < > 23.1*  --  24.3* 37* 31.1* 32.0* 33.0*  MCV 94.2  < > 93.1  --  92.4  --  94.8  --   --   PLT 150  < > 153  --  192 230 193  --   --   < > = values in this interval not displayed.  Lipid Panel:  Recent Labs  11/19/15  HDL 30*      ASSESSMENT/PLAN:  Hypokalemia - start KCL ER 20 meq take 2 tabs = 40 meq PO TID today then BID; BMP in AM Lab Results  Component Value Date   K 2.8* 12/12/2015    Subclinical hypothyroidism -  Re-check tsh in 1 month tsh 14.834 (elevated), free T4 0.90 (WNL,  Free T3 2.60 (WNL)    Kenard Gower, NP [facility] [phone]

## 2015-12-13 LAB — BASIC METABOLIC PANEL
BUN: 17 mg/dL (ref 4–21)
CREATININE: 0.8 mg/dL (ref 0.6–1.3)
Glucose: 109 mg/dL
Potassium: 4.1 mmol/L (ref 3.4–5.3)
SODIUM: 145 mmol/L (ref 137–147)

## 2015-12-19 LAB — CBC AND DIFFERENTIAL
HCT: 30 % — AB (ref 41–53)
Hemoglobin: 9.3 g/dL — AB (ref 13.5–17.5)
Neutrophils Absolute: 3 /uL
Platelets: 286 10*3/uL (ref 150–399)
WBC: 6.3 10^3/mL

## 2015-12-20 ENCOUNTER — Emergency Department (HOSPITAL_COMMUNITY): Payer: Medicare Other

## 2015-12-20 ENCOUNTER — Inpatient Hospital Stay (HOSPITAL_COMMUNITY): Payer: Medicare Other

## 2015-12-20 ENCOUNTER — Encounter (HOSPITAL_COMMUNITY): Payer: Self-pay | Admitting: Radiology

## 2015-12-20 ENCOUNTER — Inpatient Hospital Stay (HOSPITAL_COMMUNITY)
Admission: EM | Admit: 2015-12-20 | Discharge: 2015-12-22 | DRG: 811 | Disposition: A | Discharge status: Skilled Nursing Facility | Payer: Medicare Other | Attending: Internal Medicine | Admitting: Internal Medicine

## 2015-12-20 DIAGNOSIS — E876 Hypokalemia: Secondary | ICD-10-CM | POA: Diagnosis present

## 2015-12-20 DIAGNOSIS — D649 Anemia, unspecified: Secondary | ICD-10-CM | POA: Diagnosis present

## 2015-12-20 DIAGNOSIS — K219 Gastro-esophageal reflux disease without esophagitis: Secondary | ICD-10-CM | POA: Diagnosis present

## 2015-12-20 DIAGNOSIS — M810 Age-related osteoporosis without current pathological fracture: Secondary | ICD-10-CM | POA: Diagnosis present

## 2015-12-20 DIAGNOSIS — R634 Abnormal weight loss: Secondary | ICD-10-CM | POA: Diagnosis not present

## 2015-12-20 DIAGNOSIS — Z87891 Personal history of nicotine dependence: Secondary | ICD-10-CM

## 2015-12-20 DIAGNOSIS — R195 Other fecal abnormalities: Secondary | ICD-10-CM | POA: Diagnosis not present

## 2015-12-20 DIAGNOSIS — R64 Cachexia: Secondary | ICD-10-CM | POA: Diagnosis present

## 2015-12-20 DIAGNOSIS — E785 Hyperlipidemia, unspecified: Secondary | ICD-10-CM | POA: Diagnosis present

## 2015-12-20 DIAGNOSIS — Z66 Do not resuscitate: Secondary | ICD-10-CM

## 2015-12-20 DIAGNOSIS — E43 Unspecified severe protein-calorie malnutrition: Secondary | ICD-10-CM | POA: Insufficient documentation

## 2015-12-20 DIAGNOSIS — Z7401 Bed confinement status: Secondary | ICD-10-CM | POA: Diagnosis not present

## 2015-12-20 DIAGNOSIS — Z96642 Presence of left artificial hip joint: Secondary | ICD-10-CM | POA: Diagnosis present

## 2015-12-20 DIAGNOSIS — R630 Anorexia: Secondary | ICD-10-CM

## 2015-12-20 DIAGNOSIS — Z681 Body mass index (BMI) 19 or less, adult: Secondary | ICD-10-CM

## 2015-12-20 DIAGNOSIS — E039 Hypothyroidism, unspecified: Secondary | ICD-10-CM | POA: Diagnosis present

## 2015-12-20 DIAGNOSIS — Z8673 Personal history of transient ischemic attack (TIA), and cerebral infarction without residual deficits: Secondary | ICD-10-CM

## 2015-12-20 DIAGNOSIS — K921 Melena: Secondary | ICD-10-CM | POA: Diagnosis present

## 2015-12-20 DIAGNOSIS — Z515 Encounter for palliative care: Secondary | ICD-10-CM

## 2015-12-20 DIAGNOSIS — N179 Acute kidney failure, unspecified: Secondary | ICD-10-CM | POA: Diagnosis present

## 2015-12-20 DIAGNOSIS — F0390 Unspecified dementia without behavioral disturbance: Secondary | ICD-10-CM | POA: Diagnosis present

## 2015-12-20 DIAGNOSIS — R55 Syncope and collapse: Secondary | ICD-10-CM | POA: Diagnosis present

## 2015-12-20 DIAGNOSIS — G40909 Epilepsy, unspecified, not intractable, without status epilepticus: Secondary | ICD-10-CM | POA: Diagnosis present

## 2015-12-20 DIAGNOSIS — Z7982 Long term (current) use of aspirin: Secondary | ICD-10-CM

## 2015-12-20 DIAGNOSIS — N400 Enlarged prostate without lower urinary tract symptoms: Secondary | ICD-10-CM | POA: Diagnosis present

## 2015-12-20 DIAGNOSIS — D62 Acute posthemorrhagic anemia: Principal | ICD-10-CM | POA: Diagnosis present

## 2015-12-20 DIAGNOSIS — B1920 Unspecified viral hepatitis C without hepatic coma: Secondary | ICD-10-CM | POA: Diagnosis present

## 2015-12-20 DIAGNOSIS — K5900 Constipation, unspecified: Secondary | ICD-10-CM | POA: Diagnosis present

## 2015-12-20 DIAGNOSIS — F329 Major depressive disorder, single episode, unspecified: Secondary | ICD-10-CM | POA: Diagnosis present

## 2015-12-20 DIAGNOSIS — E860 Dehydration: Secondary | ICD-10-CM | POA: Diagnosis present

## 2015-12-20 HISTORY — DX: Unsteadiness on feet: R26.81

## 2015-12-20 HISTORY — DX: Unspecified lack of coordination: R27.9

## 2015-12-20 HISTORY — DX: Muscle weakness (generalized): M62.81

## 2015-12-20 LAB — COMPREHENSIVE METABOLIC PANEL
ALK PHOS: 61 U/L (ref 38–126)
ALT: 25 U/L (ref 17–63)
ANION GAP: 7 (ref 5–15)
AST: 41 U/L (ref 15–41)
Albumin: 2.7 g/dL — ABNORMAL LOW (ref 3.5–5.0)
BUN: 28 mg/dL — ABNORMAL HIGH (ref 6–20)
CALCIUM: 8.4 mg/dL — AB (ref 8.9–10.3)
CO2: 20 mmol/L — ABNORMAL LOW (ref 22–32)
CREATININE: 0.94 mg/dL (ref 0.61–1.24)
Chloride: 111 mmol/L (ref 101–111)
Glucose, Bld: 139 mg/dL — ABNORMAL HIGH (ref 65–99)
Potassium: 2.5 mmol/L — CL (ref 3.5–5.1)
Sodium: 138 mmol/L (ref 135–145)
TOTAL PROTEIN: 6.3 g/dL — AB (ref 6.5–8.1)
Total Bilirubin: 0.5 mg/dL (ref 0.3–1.2)

## 2015-12-20 LAB — CBC WITH DIFFERENTIAL/PLATELET
Basophils Absolute: 0 10*3/uL (ref 0.0–0.1)
Basophils Relative: 0 %
EOS ABS: 0 10*3/uL (ref 0.0–0.7)
EOS PCT: 0 %
HEMATOCRIT: 22.8 % — AB (ref 39.0–52.0)
HEMOGLOBIN: 7.5 g/dL — AB (ref 13.0–17.0)
LYMPHS PCT: 16 %
Lymphs Abs: 1.8 10*3/uL (ref 0.7–4.0)
MCH: 32.1 pg (ref 26.0–34.0)
MCHC: 32.9 g/dL (ref 30.0–36.0)
MCV: 97.4 fL (ref 78.0–100.0)
MONO ABS: 0.6 10*3/uL (ref 0.1–1.0)
Monocytes Relative: 5 %
NEUTROS PCT: 79 %
Neutro Abs: 8.6 10*3/uL — ABNORMAL HIGH (ref 1.7–7.7)
Platelets: 243 10*3/uL (ref 150–400)
RBC: 2.34 MIL/uL — AB (ref 4.22–5.81)
RDW: 15 % (ref 11.5–15.5)
WBC: 11 10*3/uL — AB (ref 4.0–10.5)

## 2015-12-20 LAB — RETICULOCYTES
RBC.: 2.38 MIL/uL — ABNORMAL LOW (ref 4.22–5.81)
RETIC COUNT ABSOLUTE: 76.2 10*3/uL (ref 19.0–186.0)
RETIC CT PCT: 3.2 % — AB (ref 0.4–3.1)

## 2015-12-20 LAB — IRON AND TIBC
IRON: 28 ug/dL — AB (ref 45–182)
Saturation Ratios: 11 % — ABNORMAL LOW (ref 17.9–39.5)
TIBC: 263 ug/dL (ref 250–450)
UIBC: 235 ug/dL

## 2015-12-20 LAB — BASIC METABOLIC PANEL
BUN: 29 mg/dL — AB (ref 4–21)
Creatinine: 0.6 mg/dL (ref 0.6–1.3)
Glucose: 129 mg/dL
Potassium: 3 mmol/L — AB (ref 3.4–5.3)
Sodium: 144 mmol/L (ref 137–147)

## 2015-12-20 LAB — PREPARE RBC (CROSSMATCH)

## 2015-12-20 LAB — FERRITIN: FERRITIN: 240 ng/mL (ref 24–336)

## 2015-12-20 LAB — FOLATE: Folate: 12.6 ng/mL (ref >5.9)

## 2015-12-20 LAB — CBG MONITORING, ED: GLUCOSE-CAPILLARY: 126 mg/dL — AB (ref 65–99)

## 2015-12-20 LAB — D-DIMER, QUANTITATIVE (NOT AT ARMC): D DIMER QUANT: 0.6 ug{FEU}/mL — AB (ref 0.00–0.50)

## 2015-12-20 LAB — TROPONIN I: Troponin I: 0.04 ng/mL (ref <0.03)

## 2015-12-20 LAB — POC OCCULT BLOOD, ED: FECAL OCCULT BLD: POSITIVE — AB

## 2015-12-20 LAB — VITAMIN B12: Vitamin B-12: 533 pg/mL (ref 180–914)

## 2015-12-20 MED ORDER — SODIUM CHLORIDE 0.9 % IV SOLN
Freq: Once | INTRAVENOUS | Status: AC
  Administered 2015-12-20: via INTRAVENOUS

## 2015-12-20 MED ORDER — POTASSIUM CHLORIDE 10 MEQ/100ML IV SOLN
10.0000 meq | INTRAVENOUS | Status: AC
  Administered 2015-12-21 (×4): 10 meq via INTRAVENOUS
  Filled 2015-12-20 (×3): qty 100

## 2015-12-20 MED ORDER — IOPAMIDOL (ISOVUE-300) INJECTION 61%
INTRAVENOUS | Status: AC
  Administered 2015-12-20: 100 mL
  Filled 2015-12-20: qty 100

## 2015-12-20 MED ORDER — SODIUM CHLORIDE 0.9 % IV BOLUS (SEPSIS)
500.0000 mL | Freq: Once | INTRAVENOUS | Status: AC
  Administered 2015-12-20: 500 mL via INTRAVENOUS

## 2015-12-20 MED ORDER — POTASSIUM CHLORIDE CRYS ER 20 MEQ PO TBCR
40.0000 meq | EXTENDED_RELEASE_TABLET | Freq: Once | ORAL | Status: AC
  Administered 2015-12-21: 40 meq via ORAL
  Filled 2015-12-20: qty 2

## 2015-12-20 NOTE — ED Notes (Signed)
Pt unable to void at this time.  Will attempt to get urine later.

## 2015-12-20 NOTE — ED Provider Notes (Signed)
[facility]-EMERGENCY DEPT Provider Note   CSN: [id number] Arrival date & time: 12/20/15  1520  First Provider Contact:  None       History   Chief Complaint Chief Complaint  Patient presents with  . Loss of Consciousness    HPI Randall Hoffman is a 81 y.o. male.   Loss of Consciousness   This is a new problem. The current episode started 1 to 2 hours ago. The problem occurs constantly. The problem has been rapidly improving. He lost consciousness for a period of 1 to 5 minutes. The problem is associated with normal activity. Associated symptoms include abdominal pain, back pain, chest pain, dizziness, light-headedness and nausea. He has tried nothing for the symptoms.    Past Medical History:  Diagnosis Date  . Acute blood loss anemia   . BPH (benign prostatic hyperplasia)   . Chronic depression   . Constipation   . ESBL (extended spectrum beta-lactamase) producing bacteria infection   . Femur fracture, left (HCC)   . GERD (gastroesophageal reflux disease)   . Hepatitis C   . History of CVA (cerebrovascular accident)   . HLD (hyperlipidemia)   . Hyponatremia   . Osteoporosis   . Protein calorie malnutrition (HCC)   . Seizure disorder (HCC)   . Stroke [facility])     Patient Active Problem List   Diagnosis Date Noted  . Symptomatic anemia 12/20/2015  . Occult blood positive stool 12/20/2015  . Unintentional weight loss 12/20/2015  . Atherosclerosis of aorta (HCC) 10/12/2015  . Left displaced femoral neck fracture (HCC) 10/11/2015  . Seizure disorder (HCC) 10/11/2015  . Hepatitis C 10/11/2015  . History of stroke in prior 3 months 10/11/2015    Past Surgical History:  Procedure Laterality Date  . TOTAL HIP ARTHROPLASTY Left 10/11/2015   Procedure: TOTAL HIP ARTHROPLASTY ANTERIOR APPROACH;  Surgeon: Kathryne Hitch, MD;  Location: [facility] OR;  Service: Orthopedics;  Laterality: Left;  . TOTAL HIP REVISION Left 10/13/2015   Procedure:   REVISION OF LEFT HIP BALL;   Surgeon: Kathryne Hitch, MD;  Location: [facility] OR;  Service: Orthopedics;  Laterality: Left;       Home Medications    Prior to Admission medications   Medication Sig Start Date End Date Taking? Authorizing Provider  acetaminophen (TYLENOL) 500 MG tablet Take 1,000 mg by mouth 2 (two) times daily.   Yes Historical Provider, MD  alendronate (FOSAMAX) 70 MG tablet Take 1 tablet (70 mg total) by mouth once a week. Take with a full glass of water on an empty stomach. 10/13/15  Yes Lora Paula, MD  aspirin EC 81 MG tablet Take 81 mg by mouth daily.   Yes Historical Provider, MD  atorvastatin (LIPITOR) 10 MG tablet Take 10 mg by mouth daily.    Yes Historical Provider, MD  baclofen (LIORESAL) 10 MG tablet Take 10 mg by mouth 3 (three) times daily.   Yes Historical Provider, MD  citalopram (CELEXA) 10 MG tablet Take 10 mg by mouth daily.   Yes Historical Provider, MD  docusate sodium (COLACE) 100 MG capsule Take 100 mg by mouth daily.   Yes Historical Provider, MD  hydrocerin (EUCERIN) CREA Apply 1 application topically daily. To bilateral legs and feet   Yes Historical Provider, MD  HYDROcodone-acetaminophen (NORCO/VICODIN) 5-325 MG tablet Take 1-2 tablets by mouth every 6 (six) hours as needed for moderate pain. 11/02/15  Yes Kirt Boys, DO  methocarbamol (ROBAXIN) 500 MG tablet Take 1 tablet (500 mg total)  by mouth every 6 (six) hours as needed for muscle spasms. 10/13/15  Yes Lora Paula, MD  pantoprazole (PROTONIX) 40 MG tablet Take 40 mg by mouth daily.   Yes Historical Provider, MD  QUEtiapine (SEROQUEL) 25 MG tablet Take 25 mg by mouth at bedtime.   Yes Historical Provider, MD  tamsulosin (FLOMAX) 0.4 MG CAPS capsule Take 0.4 mg by mouth daily.    Yes Historical Provider, MD  topiramate (TOPAMAX) 50 MG tablet Take 50 mg by mouth 2 (two) times daily.   Yes Historical Provider, MD  UNABLE TO FIND Med Name: Med pass 120 mL by mouth TID   Yes Historical Provider, MD    Family  History Family History  Problem Relation Age of Onset  . Heart disease Other     unknown, patient says family members died long ago and he cannot remember    Social History Social History  Substance Use Topics  . Smoking status: Former Games developer  . Smokeless tobacco: Not on file  . Alcohol use No     Allergies   Penicillins   Review of Systems Review of Systems  Constitutional: Positive for activity change, appetite change and fatigue.  Respiratory: Positive for cough, chest tightness and shortness of breath.   Cardiovascular: Positive for chest pain and syncope.  Gastrointestinal: Positive for abdominal pain, blood in stool, diarrhea and nausea.  Endocrine: Negative for polyuria.  Genitourinary: Positive for flank pain and hematuria.  Musculoskeletal: Positive for back pain and myalgias.  Neurological: Positive for dizziness, syncope and light-headedness.  All other systems reviewed and are negative.    Physical Exam Updated Vital Signs BP 127/78   Pulse (!) 136   Temp 97.8 F (36.6 C) (Oral)   Resp 20   SpO2 99%   Physical Exam  Constitutional: He appears well-developed and well-nourished.  HENT:  Head: Normocephalic and atraumatic.  Eyes: Conjunctivae and EOM are normal.  Neck: Normal range of motion.  Cardiovascular: Normal rate.   No murmur heard. Pulmonary/Chest: Effort normal. No respiratory distress. He has no wheezes.  Abdominal: He exhibits no distension.  Musculoskeletal: Normal range of motion.  Neurological: He is alert. He displays normal reflexes. No cranial nerve deficit.  disoriented  Nursing note and vitals reviewed.    ED Treatments / Results  Labs (all labs ordered are listed, but only abnormal results are displayed) Labs Reviewed  CBC WITH DIFFERENTIAL/PLATELET - Abnormal; Notable for the following:       Result Value   WBC 11.0 (*)    RBC 2.34 (*)    Hemoglobin 7.5 (*)    HCT 22.8 (*)    Neutro Abs 8.6 (*)    All other  components within normal limits  COMPREHENSIVE METABOLIC PANEL - Abnormal; Notable for the following:    Potassium 2.5 (*)    CO2 20 (*)    Glucose, Bld 139 (*)    BUN 28 (*)    Calcium 8.4 (*)    Total Protein 6.3 (*)    Albumin 2.7 (*)    All other components within normal limits  D-DIMER, QUANTITATIVE (NOT AT [facility]) - Abnormal; Notable for the following:    D-Dimer, Quant 0.60 (*)    All other components within normal limits  TROPONIN I - Abnormal; Notable for the following:    Troponin I 0.04 (*)    All other components within normal limits  IRON AND TIBC - Abnormal; Notable for the following:    Iron 28 (*)  Saturation Ratios 11 (*)    All other components within normal limits  RETICULOCYTES - Abnormal; Notable for the following:    Retic Ct Pct 3.2 (*)    RBC. 2.38 (*)    All other components within normal limits  CBG MONITORING, ED - Abnormal; Notable for the following:    Glucose-Capillary 126 (*)    All other components within normal limits  POC OCCULT BLOOD, ED - Abnormal; Notable for the following:    Fecal Occult Bld POSITIVE (*)    All other components within normal limits  VITAMIN B12  FOLATE  FERRITIN  URINALYSIS, ROUTINE W REFLEX MICROSCOPIC (NOT AT [facility])  OCCULT BLOOD X 1 CARD TO LAB, STOOL  CBC  BASIC METABOLIC PANEL  TYPE AND SCREEN  PREPARE RBC (CROSSMATCH)    EKG  EKG Interpretation  Date/Time:  Thursday December 20 2015 15:43:01 EDT Ventricular Rate:  104 PR Interval:    QRS Duration: 97 QT Interval:  374 QTC Calculation: 492 R Axis:   92 Text Interpretation:  Sinus tachycardia Multiple premature complexes, vent & supraven Aberrant conduction of SV complex(es) Right axis deviation Nonspecific T abnrm, anterolateral leads Borderline prolonged QT interval Confirmed by [facility] MD, Barbara Cower [phone]) on 12/20/2015 4:02:34 PM       Radiology Dg Chest 2 View  Result Date: 12/20/2015 CLINICAL DATA:  Syncope, recurrent episodes while being transported  from bed to wheelchair in the rehab center he stays in, hx: CVA EXAM: CHEST  2 VIEW COMPARISON:  10/11/2015 FINDINGS: Cardiac silhouette is normal in size and configuration. No mediastinal or hilar masses or evidence of adenopathy. Multiple small calcifications noted in the left mid to lower lung, stable. Lungs hyperexpanded but otherwise clear. No pleural effusion or pneumothorax. Skeletal structures are demineralized but grossly intact. IMPRESSION: No acute cardiopulmonary disease. Stable appearance from the prior study. Electronically Signed   By: Amie Portland M.D.   On: 12/20/2015 17:11  Ct Abdomen Pelvis W Contrast  Result Date: 12/20/2015 CLINICAL DATA:  Blood in stool EXAM: CT ABDOMEN AND PELVIS WITH CONTRAST TECHNIQUE: Multidetector CT imaging of the abdomen and pelvis was performed using the standard protocol following bolus administration of intravenous contrast. CONTRAST:  100 mL ISOVUE-300 IOPAMIDOL (ISOVUE-300) INJECTION 61% COMPARISON:  None. FINDINGS: Lower chest: Lung bases demonstrate calcified pleural plaques without focal infiltrate. Hepatobiliary: No masses or other significant abnormality. Pancreas: No mass, inflammatory changes, or other significant abnormality. Spleen: Within normal limits in size and appearance. Adrenals/Urinary Tract: No masses identified. No evidence of hydronephrosis. Stomach/Bowel: The appendix is not well visualized although no inflammatory changes are seen. Scattered fecal material is noted throughout the colon. Diverticular changes noted without diverticulitis. No small bowel abnormality is seen. Vascular/Lymphatic: Diffuse aortic calcifications are noted without aneurysmal dilatation. No significant lymphadenopathy is noted. Reproductive: No mass or other significant abnormality. Other: The bladder is well distended but demonstrates some dependent density which may be related to blood or debris. Correlation with today's urinalysis is recommended. Musculoskeletal:  Left hip replacement is noted. No acute bony abnormality is seen. Degenerative changes of the lumbar spine as well as postoperative changes of the lumbar spine are seen. Chronic compression deformity at L3 is noted. IMPRESSION: Dependent density is noted within the bladder which may be related to debris or possible blood products. Correlation with today's urinalysis is recommended. No focal abnormality to correspond with the patient's given clinical history is seen. The appendix is not visualized although no inflammatory changes are noted. No other acute abnormality  is noted. Electronically Signed   By: Alcide Clever M.D.   On: 12/20/2015 22:48   Procedures Procedures (including critical care time)  CRITICAL CARE Performed by: Marily Memos Total critical care time: 35 minutes Critical care time was exclusive of separately billable procedures and treating other patients. Critical care was necessary to treat or prevent imminent or life-threatening deterioration. Critical care was time spent personally by me on the following activities: development of treatment plan with patient and/or surrogate as well as nursing, discussions with consultants, evaluation of patient's response to treatment, examination of patient, obtaining history from patient or surrogate, ordering and performing treatments and interventions, ordering and review of laboratory studies, ordering and review of radiographic studies, pulse oximetry and re-evaluation of patient's condition.   Medications Ordered in ED Medications  0.9 %  sodium chloride infusion (not administered)  potassium chloride 10 mEq in 100 mL IVPB (not administered)  potassium chloride SA (K-DUR,KLOR-CON) CR tablet 40 mEq (not administered)  sodium chloride 0.9 % bolus 500 mL (0 mLs Intravenous Stopped 12/20/15 1816)  iopamidol (ISOVUE-300) 61 % injection (100 mLs  Contrast Given 12/20/15 2213)     Initial Impression / Assessment and Plan / ED Course  I have  reviewed the triage vital signs and the nursing notes.  Pertinent labs & imaging results that were available during my care of the patient were reviewed by me and considered in my medical decision making (see chart for details).  Clinical Course  Comment By Time  Tachycardic, in an dout of afib. Pale conjunctiva. Scaphoid abdomen, malnourished. Suspect GI bleed possibly 2/2 neoplasm. Patient has DNR and states he would not want a blood transfusion. Could also be PE v infection. Plan to workup appropriately and discuss with aptient and family the best course of action.  Marily Memos, MD 07/27 1639    On Reevaluation the patient states that he would accept a blood transfusion while getting the cause of his GI bleeding worked up. Still is not sure how far to go down a therapeutic pathway but wants to know what's going on. With this information a blood transfusion was started, CT ordered to evaluate for any malignancy, and hospitalist was counseled to for admission.  Final Clinical Impressions(s) / ED Diagnoses   Final diagnoses:  Syncope and collapse  Dehydration  AKI (acute kidney injury) (HCC)  Hypokalemia  Symptomatic anemia    New Prescriptions Current Discharge Medication List       Marily Memos, MD 12/20/15 [phone]

## 2015-12-20 NOTE — ED Notes (Signed)
Dr. Erin Hearing at bedside with patient/family at this time.

## 2015-12-20 NOTE — ED Notes (Signed)
Patient transported to CT 

## 2015-12-20 NOTE — H&P (Signed)
History and Physical    Randall Hoffman ZOX:[id number] DOB: Nov 30, 1934 DOA: 12/20/2015   PCP: No PCP Per Patient Chief Complaint:  Chief Complaint  Patient presents with  . Loss of Consciousness    HPI: Randall Hoffman is a 81 y.o. male with medical history significant of stroke, femur fracture a couple of months ago, has not been doing very well since the femur fracture (basically bed bound now).  DNR at baseline.  He is sent in from his rehab today after a syncopal episode.  LOC lasted 1-5 mins.  Occurred in context of transferring to a hospital bed.  He has also lost 50 lbs of weight in the last couple of months as well.  ED Course: Hemoccult positive, HGB 7.5.  Review of Systems: As per HPI otherwise 10 point review of systems negative.    Past Medical History:  Diagnosis Date  . Acute blood loss anemia   . BPH (benign prostatic hyperplasia)   . Chronic depression   . Constipation   . ESBL (extended spectrum beta-lactamase) producing bacteria infection   . Femur fracture, left (HCC)   . GERD (gastroesophageal reflux disease)   . Hepatitis C   . History of CVA (cerebrovascular accident)   . HLD (hyperlipidemia)   . Hyponatremia   . Osteoporosis   . Protein calorie malnutrition (HCC)   . Seizure disorder (HCC)   . Stroke [facility])     Past Surgical History:  Procedure Laterality Date  . TOTAL HIP ARTHROPLASTY Left 10/11/2015   Procedure: TOTAL HIP ARTHROPLASTY ANTERIOR APPROACH;  Surgeon: Kathryne Hitch, MD;  Location: [facility] OR;  Service: Orthopedics;  Laterality: Left;  . TOTAL HIP REVISION Left 10/13/2015   Procedure:   REVISION OF LEFT HIP BALL;  Surgeon: Kathryne Hitch, MD;  Location: [facility] OR;  Service: Orthopedics;  Laterality: Left;     reports that he has quit smoking. He does not have any smokeless tobacco history on file. He reports that he does not drink alcohol or use drugs.  Allergies  Allergen Reactions  . Penicillins Rash    Note patient had  ancef on 10/11/15 with no reported complications    Family History  Problem Relation Age of Onset  . Heart disease Other     unknown, patient says family members died long ago and he cannot remember      Prior to Admission medications   Medication Sig Start Date End Date Taking? Authorizing Provider  acetaminophen (TYLENOL) 500 MG tablet Take 1,000 mg by mouth 2 (two) times daily.    Historical Provider, MD  alendronate (FOSAMAX) 70 MG tablet Take 1 tablet (70 mg total) by mouth once a week. Take with a full glass of water on an empty stomach. 10/13/15   Lora Paula, MD  aspirin EC 81 MG tablet Take 81 mg by mouth daily.    Historical Provider, MD  atorvastatin (LIPITOR) 10 MG tablet Take 10 mg by mouth daily.     Historical Provider, MD  baclofen (LIORESAL) 10 MG tablet Take 10 mg by mouth 3 (three) times daily.    Historical Provider, MD  citalopram (CELEXA) 10 MG tablet Take 10 mg by mouth daily.    Historical Provider, MD  docusate sodium (COLACE) 100 MG capsule Take 100 mg by mouth daily.    Historical Provider, MD  ferrous sulfate 325 (65 FE) MG tablet Take 325 mg by mouth daily with breakfast.     Historical Provider, MD  hydrocerin (EUCERIN) CREA  Apply 1 application topically daily. To bilateral legs and feet    Historical Provider, MD  HYDROcodone-acetaminophen (NORCO/VICODIN) 5-325 MG tablet Take 1-2 tablets by mouth every 6 (six) hours as needed for moderate pain. 11/02/15   Kirt Boys, DO  methocarbamol (ROBAXIN) 500 MG tablet Take 1 tablet (500 mg total) by mouth every 6 (six) hours as needed for muscle spasms. 10/13/15   Lora Paula, MD  mirtazapine (REMERON) 15 MG tablet Take 15 mg by mouth at bedtime.    Historical Provider, MD  pantoprazole (PROTONIX) 40 MG tablet Take 40 mg by mouth daily.    Historical Provider, MD  potassium chloride SA (K-DUR,KLOR-CON) 20 MEQ tablet Take 40 mEq by mouth. Take 40 mEq TID x1 day, then 40 mEq BID    Historical Provider, MD    tamsulosin (FLOMAX) 0.4 MG CAPS capsule Take 0.4 mg by mouth at bedtime.     Historical Provider, MD  topiramate (TOPAMAX) 50 MG tablet Take 50 mg by mouth 2 (two) times daily.    Historical Provider, MD  UNABLE TO FIND Med Name: Med pass 120 mL by mouth TID    Historical Provider, MD    Physical Exam: Vitals:   12/20/15 1715 12/20/15 1730 12/20/15 1815 12/20/15 1900  BP: 111/72 119/65 120/63 117/63  Pulse: 94 103 105 87  Resp: Temp:      TempSrc:      SpO2: 100% 100% 100% 100%      Constitutional: NAD, calm, comfortable Eyes: PERRL, lids and conjunctivae normal ENMT: Mucous membranes are moist. Posterior pharynx clear of any exudate or lesions.Normal dentition.  Neck: normal, supple, no masses, no thyromegaly Respiratory: clear to auscultation bilaterally, no wheezing, no crackles. Normal respiratory effort. No accessory muscle use.  Cardiovascular: Regular rate and rhythm, no murmurs / rubs / gallops. No extremity edema. 2+ pedal pulses. No carotid bruits.  Abdomen: no tenderness, no masses palpated. No hepatosplenomegaly. Bowel sounds positive.  Cachexia Musculoskeletal: no clubbing / cyanosis. No joint deformity upper and lower extremities. Good ROM, no contractures. Normal muscle tone.  Skin: no rashes, lesions, ulcers. No induration Neurologic: CN 2-12 grossly intact. Sensation intact, DTR normal. Strength 5/5 in all 4.  Psychiatric: Normal judgment and insight. Alert and oriented x 3. Normal mood.    Labs on Admission: I have personally reviewed following labs and imaging studies  CBC:  Recent Labs Lab 12/20/15 1623  WBC 11.0*  NEUTROABS 8.6*  HGB 7.5*  HCT 22.8*  MCV 97.4  PLT 243   Basic Metabolic Panel:  Recent Labs Lab 12/20/15 1623  NA 138  K 2.5*  CL 111  CO2 20*  GLUCOSE 139*  BUN 28*  CREATININE 0.94  CALCIUM 8.4*   GFR: Estimated Creatinine Clearance: 48.4 mL/min (by C-G formula based on SCr of 0.94 mg/dL). Liver Function  Tests:  Recent Labs Lab 12/20/15 1623  AST 41  ALT 25  ALKPHOS 61  BILITOT 0.5  PROT 6.3*  ALBUMIN 2.7*   No results for input(s): LIPASE, AMYLASE in the last 168 hours. No results for input(s): AMMONIA in the last 168 hours. Coagulation Profile: No results for input(s): INR, PROTIME in the last 168 hours. Cardiac Enzymes:  Recent Labs Lab 12/20/15 1623  TROPONINI 0.04*   BNP (last 3 results) No results for input(s): PROBNP in the last 8760 hours. HbA1C: No results for input(s): HGBA1C in the last 72 hours. CBG:  Recent Labs Lab 12/20/15 1752  GLUCAP 126*  Lipid Profile: No results for input(s): CHOL, HDL, LDLCALC, TRIG, CHOLHDL, LDLDIRECT in the last 72 hours. Thyroid Function Tests: No results for input(s): TSH, T4TOTAL, FREET4, T3FREE, THYROIDAB in the last 72 hours. Anemia Panel: No results for input(s): VITAMINB12, FOLATE, FERRITIN, TIBC, IRON, RETICCTPCT in the last 72 hours. Urine analysis:    Component Value Date/Time   COLORURINE AMBER (A) 10/12/2015 0153   APPEARANCEUR CLEAR 10/12/2015 0153   LABSPEC 1.022 10/12/2015 0153   PHURINE 6.0 10/12/2015 0153   GLUCOSEU NEGATIVE 10/12/2015 0153   HGBUR NEGATIVE 10/12/2015 0153   BILIRUBINUR NEGATIVE 10/12/2015 0153   KETONESUR 15 (A) 10/12/2015 0153   PROTEINUR NEGATIVE 10/12/2015 0153   NITRITE NEGATIVE 10/12/2015 0153   LEUKOCYTESUR SMALL (A) 10/12/2015 0153   Sepsis Labs: @LABRCNTIP (procalcitonin:4,lacticidven:4) )No results found for this or any previous visit (from the past 240 hour(s)).   Radiological Exams on Admission: Dg Chest 2 View  Result Date: 12/20/2015 CLINICAL DATA:  Syncope, recurrent episodes while being transported from bed to wheelchair in the rehab center he stays in, hx: CVA EXAM: CHEST  2 VIEW COMPARISON:  10/11/2015 FINDINGS: Cardiac silhouette is normal in size and configuration. No mediastinal or hilar masses or evidence of adenopathy. Multiple small calcifications noted in  the left mid to lower lung, stable. Lungs hyperexpanded but otherwise clear. No pleural effusion or pneumothorax. Skeletal structures are demineralized but grossly intact. IMPRESSION: No acute cardiopulmonary disease. Stable appearance from the prior study. Electronically Signed   By: Amie Portland M.D.   On: 12/20/2015 17:11   EKG: Independently reviewed.  Assessment/Plan Principal Problem:   Symptomatic anemia Active Problems:   Occult blood positive stool   Unintentional weight loss   Symptomatic anemia and occult blood positive stool -  Anemia pnl, dosent appear to be acute blood loss  CT abd/pelvis ordered and pending  If negative then would consider GI consult  If positive, then would re-address goals of care with patient (who initially didn't even want admission for blood transfusion, I suspect if CT demonstrates obvious cancer that he would more likely request the palliative care route).  Unintentional weight loss -  Overall this patients picture is highly worrisome for an underlying malignant neoplastic issue in his GI tree.  CT abd/pelvis ordered and pending   DVT prophylaxis: SCDs only Code Status: DNR/DNI at baseline Family Communication: No family in room Consults called: None Admission status: Admit to inpatient   Hillary Bow DO [facility] Pager [phone] from 7PM-7AM  If 7AM-7PM, please contact the day physician for the patient [email or web address] Password [facility]  12/20/2015, 9:23 PM

## 2015-12-20 NOTE — ED Notes (Signed)
Called CT. Patient to be taken to CT for testing prior to initiation of blood transfusion. Reports 15 minutes.

## 2015-12-20 NOTE — ED Notes (Signed)
Pt st's he has been feeling dizzy for "a long time".  Pt's only complaint is his back which is a chronic condition.  Pt alert and oriented per his norm.  Pt is currently in rehab at [facility]

## 2015-12-20 NOTE — ED Notes (Signed)
Attempted to obtain urine sample and pt not able to void at this time.

## 2015-12-20 NOTE — ED Triage Notes (Signed)
Pt to ED via [facility] after reported having a syncopal episode while being transferred from wheelchair to his hospital bed.  On EMS arrival pt was awake and alert per his norm.  Pt from [facility].

## 2015-12-21 ENCOUNTER — Encounter (HOSPITAL_COMMUNITY): Payer: Self-pay

## 2015-12-21 DIAGNOSIS — Z515 Encounter for palliative care: Secondary | ICD-10-CM

## 2015-12-21 DIAGNOSIS — R630 Anorexia: Secondary | ICD-10-CM

## 2015-12-21 DIAGNOSIS — Z66 Do not resuscitate: Secondary | ICD-10-CM

## 2015-12-21 DIAGNOSIS — E43 Unspecified severe protein-calorie malnutrition: Secondary | ICD-10-CM | POA: Insufficient documentation

## 2015-12-21 LAB — MAGNESIUM: Magnesium: 2 mg/dL (ref 1.7–2.4)

## 2015-12-21 LAB — CBC
HCT: 25.5 % — ABNORMAL LOW (ref 39.0–52.0)
Hemoglobin: 8.4 g/dL — ABNORMAL LOW (ref 13.0–17.0)
MCH: 31.5 pg (ref 26.0–34.0)
MCHC: 32.9 g/dL (ref 30.0–36.0)
MCV: 95.5 fL (ref 78.0–100.0)
PLATELETS: 215 10*3/uL (ref 150–400)
RBC: 2.67 MIL/uL — AB (ref 4.22–5.81)
RDW: 15.1 % (ref 11.5–15.5)
WBC: 8.1 10*3/uL (ref 4.0–10.5)

## 2015-12-21 LAB — SURGICAL PCR SCREEN
MRSA, PCR: NEGATIVE
Staphylococcus aureus: NEGATIVE

## 2015-12-21 LAB — BASIC METABOLIC PANEL
ANION GAP: 7 (ref 5–15)
BUN: 22 mg/dL — ABNORMAL HIGH (ref 6–20)
CALCIUM: 8.1 mg/dL — AB (ref 8.9–10.3)
CO2: 21 mmol/L — ABNORMAL LOW (ref 22–32)
Chloride: 110 mmol/L (ref 101–111)
Creatinine, Ser: 0.73 mg/dL (ref 0.61–1.24)
Glucose, Bld: 100 mg/dL — ABNORMAL HIGH (ref 65–99)
POTASSIUM: 3 mmol/L — AB (ref 3.5–5.1)
SODIUM: 138 mmol/L (ref 135–145)

## 2015-12-21 LAB — TYPE AND SCREEN
ABO/RH(D): B POS
Antibody Screen: NEGATIVE
UNIT DIVISION: 0

## 2015-12-21 MED ORDER — TOPIRAMATE 25 MG PO TABS
50.0000 mg | ORAL_TABLET | Freq: Two times a day (BID) | ORAL | Status: DC
  Administered 2015-12-21 – 2015-12-22 (×2): 50 mg via ORAL
  Filled 2015-12-21 (×3): qty 2

## 2015-12-21 MED ORDER — SENNA 8.6 MG PO TABS: 1.0000 | ORAL_TABLET | Freq: Every evening | ORAL | Status: DC | PRN

## 2015-12-21 MED ORDER — ENSURE ENLIVE PO LIQD: 237.0000 mL | Freq: Two times a day (BID) | ORAL | Status: DC

## 2015-12-21 MED ORDER — ASPIRIN EC 81 MG PO TBEC
81.0000 mg | DELAYED_RELEASE_TABLET | Freq: Every day | ORAL | Status: DC
  Filled 2015-12-21: qty 1

## 2015-12-21 MED ORDER — MORPHINE SULFATE (PF) 2 MG/ML IV SOLN: 1.0000 mg | INTRAVENOUS | Status: DC | PRN

## 2015-12-21 MED ORDER — TAMSULOSIN HCL 0.4 MG PO CAPS
0.4000 mg | ORAL_CAPSULE | Freq: Every day | ORAL | Status: DC
  Administered 2015-12-21: 0.4 mg via ORAL
  Filled 2015-12-21: qty 1

## 2015-12-21 MED ORDER — SENNA 8.6 MG PO TABS: 1.0000 | ORAL_TABLET | Freq: Two times a day (BID) | ORAL | Status: DC | PRN

## 2015-12-21 MED ORDER — METHOCARBAMOL 500 MG PO TABS: 500.0000 mg | ORAL_TABLET | Freq: Four times a day (QID) | ORAL | Status: DC | PRN

## 2015-12-21 MED ORDER — ONDANSETRON HCL 4 MG/2ML IJ SOLN: 4.0000 mg | Freq: Four times a day (QID) | INTRAMUSCULAR | Status: DC | PRN

## 2015-12-21 MED ORDER — BACLOFEN 10 MG PO TABS
10.0000 mg | ORAL_TABLET | Freq: Three times a day (TID) | ORAL | Status: DC
  Administered 2015-12-21 – 2015-12-22 (×2): 10 mg via ORAL
  Filled 2015-12-21 (×3): qty 1

## 2015-12-21 MED ORDER — CETYLPYRIDINIUM CHLORIDE 0.05 % MT LIQD
7.0000 mL | Freq: Two times a day (BID) | OROMUCOSAL | Status: DC
  Administered 2015-12-21: 7 mL via OROMUCOSAL

## 2015-12-21 MED ORDER — HALOPERIDOL LACTATE 5 MG/ML IJ SOLN: 1.0000 mg | Freq: Four times a day (QID) | INTRAMUSCULAR | Status: DC | PRN

## 2015-12-21 MED ORDER — BOOST / RESOURCE BREEZE PO LIQD: 1.0000 | Freq: Three times a day (TID) | ORAL | Status: DC

## 2015-12-21 MED ORDER — POTASSIUM CHLORIDE CRYS ER 20 MEQ PO TBCR
40.0000 meq | EXTENDED_RELEASE_TABLET | Freq: Two times a day (BID) | ORAL | Status: AC
  Administered 2015-12-21 (×2): 40 meq via ORAL
  Filled 2015-12-21 (×2): qty 2

## 2015-12-21 MED ORDER — POTASSIUM CHLORIDE 10 MEQ/100ML IV SOLN
INTRAVENOUS | Status: AC
  Filled 2015-12-21: qty 100

## 2015-12-21 MED ORDER — CITALOPRAM HYDROBROMIDE 20 MG PO TABS
10.0000 mg | ORAL_TABLET | Freq: Every day | ORAL | Status: DC
  Administered 2015-12-21 – 2015-12-22 (×2): 10 mg via ORAL
  Filled 2015-12-21 (×2): qty 1

## 2015-12-21 MED ORDER — HYDROCODONE-ACETAMINOPHEN 5-325 MG PO TABS
1.0000 | ORAL_TABLET | Freq: Four times a day (QID) | ORAL | Status: DC | PRN
  Administered 2015-12-21: 1 via ORAL
  Administered 2015-12-21: 2 via ORAL
  Administered 2015-12-21: 1 via ORAL
  Administered 2015-12-22: 2 via ORAL
  Filled 2015-12-21 (×2): qty 2
  Filled 2015-12-21 (×2): qty 1

## 2015-12-21 MED ORDER — QUETIAPINE FUMARATE 50 MG PO TABS
25.0000 mg | ORAL_TABLET | Freq: Every day | ORAL | Status: DC
Start: 2015-12-21 — End: 2015-12-22
  Administered 2015-12-21: 25 mg via ORAL
  Filled 2015-12-21: qty 1

## 2015-12-21 MED ORDER — DOCUSATE SODIUM 100 MG PO CAPS
100.0000 mg | ORAL_CAPSULE | Freq: Every day | ORAL | Status: DC
  Administered 2015-12-22: 100 mg via ORAL
  Filled 2015-12-21: qty 1

## 2015-12-21 MED ORDER — ACETAMINOPHEN 500 MG PO TABS
1000.0000 mg | ORAL_TABLET | Freq: Two times a day (BID) | ORAL | Status: DC
  Administered 2015-12-21: 1000 mg via ORAL
  Filled 2015-12-21: qty 2

## 2015-12-21 MED ORDER — PANTOPRAZOLE SODIUM 40 MG PO TBEC
40.0000 mg | DELAYED_RELEASE_TABLET | Freq: Every day | ORAL | Status: DC
  Filled 2015-12-21 (×2): qty 1

## 2015-12-21 MED ORDER — ATORVASTATIN CALCIUM 10 MG PO TABS: 10.0000 mg | ORAL_TABLET | Freq: Every day | ORAL | Status: DC

## 2015-12-21 NOTE — Progress Notes (Addendum)
Addendum  Discussed in detail with patient's daughter Ms. Margot Chimes.  Patient and his wife lived in [place] until May 2017. He was hospitalized in [place] on 08/30/2015 when his wife found him on the floor. He was diagnosed to have strokes 2. He has been bedbound since then. Daughter went to visit them and decided [place]10[place]15 and he was in an assisted facility for a day where he sustained a fall and fractured his hip. Hospitalized at [facility] for 5 days and underwent hip fracture surgery. Discharged to [facility] where he was for approximately 2 weeks for rehabilitation and then transferred to [facility] where he has been since. Prior to initial hospitalization in [place], patient had been gradually declining, ambulating with the help of a walker, having falls, difficulty moving around, leg edema and was on diuretics for same. Had memory deficits. Since 3 months, rapid decline in mental status but current mental status is not significantly different than his baseline over 3 months. Apparently had an episode of black stools while at [facility] and told to have blood in stools. Subsequently transferred to [facility] and apparently not followed up. No reports of further black stools, blood in stools or bleeding elsewhere. Had EGD remotely in [place]-reports not known. Never had colonoscopy. She was told by [facility] that while he was being transferred from wheelchair to bed on day of admission, he passed out and his blood pressure was 79/50 but no other details available.  Discussed extensively regarding treatment options including aggressive care which would involve GI consultation, possible upper and lower endoscopies, frequent lab draws, transfuse as needed, replace thyroxine, dietitian consultation-may make recommendations but would not change if patient continues to refuse to eat as he has done for several months. This versus palliative care option and transitioning to  comfort oriented care, no further lab draws, aggressive intervention/evaluations or blood transfusions. Lupita Leash indicated that she had been discussing this with her mother and wishes to pursue palliative route and she was very clear about this. Requested palliative care consultation.  Marcellus Scott, MD, FACP, FHM. [facility] Pager [phone]  If 7PM-7AM, please contact night-coverage [email or web address] Password [email or web address] 12/21/2015, 1:42 PM

## 2015-12-21 NOTE — Progress Notes (Signed)
PROGRESS NOTE  Randall Hoffman  ZOX:[id number] DOB: Nov 26, 1934  DOA: 12/20/2015 PCP: No PCP Per Patient   Brief Narrative:  81 year old male, SNF resident, PMH of dementia, anemia, depression, GERD, hepatitis C, HLD, protein calorie malnutrition and seizure disorder, hospitalized 10/11/15-10/16/15 for left displaced femoral neck fracture and underwent total hip arthroplasty, apparently has not been doing very well since the hospitalization (basically bedbound), sent to [facility] ED from SNF on 12/20/15 after a syncopal episode that lasted 1-5 minutes when patient was being transferred to hospital bed. He apparently has lost 50 pounds over the last couple of months. No history obtainable from patient secondary to mental status change.   Assessment & Plan:   Principal Problem:   Symptomatic anemia Active Problems:   Occult blood positive stool   Unintentional weight loss   Syncope - Details surrounding the episode are not available. Patient unable to provide history. No family at bedside. - Check orthostatic blood pressures. Symptomatic anemia may be a possibility and so code severe deconditioning.  Normocytic anemia - Anemia panel: Iron 28, TIBC 263, saturation ratios 11, ferritin 240, folate 1.6, B12: 533 and reticulocytes and 76.2. Suggests chronic disease. - FOBT +. No reports of overt GI bleed. Patient unable to tell whether he's had a GI workup/colonoscopy done. - Hemoglobin in the10.8-11.2 range in June. Dropped from 11.2 on 6/27 > 7.5 on admission 7/26. - Status post 1 unit PRBC transfusion on admission. Hemoglobin is improved to 8.4. - CT abdomen and pelvis without acute findings. - History of acute blood loss anemia in the past.   Hypokalemia - Replace aggressively and follow BMP. Magnesium 2.  Hypothyroidism - TFTs per recent PCP visit 7/19 (TSH 14.8, free T4: 0.9 and free T3: 2.6). Prior to that TSH: 23.47 on 11/19/15 - Start levothyroxine 12.5 MG daily. Repeat TSH in 4-6  weeks.  Profound weight loss - Unclear etiology. Probably multifactorial from poor oral intake complicating advanced dementia among other possibilities. CT abdomen without acute abnormality or suggestion of malignancy.  Advanced dementia - Need to discuss with family regarding patient's baseline mental status. CT head 2 months ago was unremarkable.  Seizure disorder - Continue topiramate.  History of CVA - Currently on aspirin 81 MG daily.  GERD - Continue Protonix.  Left femoral neck fracture status post arthroplasty - PT evaluation.  Depression - Difficult to evaluate in the context of confusion. Continue Celexa and Remeron.  BPH - Continue Flomax.  Constipation - Continue Colace.  Hyperlipidemia - Continue Lipitor.  Hepatitis C - As per discharge summary on 10/15/15, untreated due to financial/insurance constraints with affording [place]. Had been referred to [facility].  Osteoporosis  Former smoker   DVT prophylaxis: SCD's Code Status: DO NOT RESUSCITATE  Family Communication: Plan to discuss with family.  Disposition Plan: Return to SNF when clinically improved.   Consultants:   None   Procedures:   None   Antimicrobials:   None    Subjective: Very confused. No agitation. Inappropriate answers to questions. Oriented only to self. Unable to say if he had any bleeding issues and keeps referring to stool FOBT done in ED.  Objective:  Vitals:   12/20/15 2339 12/21/15 0005 12/21/15 0240 12/21/15 0407  BP: 115/77 118/68 136/90 118/71  Pulse: (!) 105 97 (!) 106 100  Resp: 20 20 20 19   Temp: 97.7 F (36.5 C) 98.1 F (36.7 C) 98.2 F (36.8 C) 98.4 F (36.9 C)  TempSrc: Oral Oral Oral Oral  SpO2: 100% 100% 98% 97%  Weight:      Height:        Intake/Output Summary (Last 24 hours) at 12/21/15 1246 Last data filed at 12/21/15 2493  Gross per 24 hour  Intake             1535 ml  Output                0 ml  Net             1535 ml   Filed Weights    12/20/15 2306  Weight: 54.9 kg (121 lb)    Examination:  General exam: Moderately built, chronically ill-looking, frail elderly male, lying comfortably propped up in bed, appears unkempt (dirt or stools on the nailbed in left fingers), has a hospital glove on right hand.  Respiratory system: Clear to auscultation. Respiratory effort normal. Cardiovascular system: S1 & S2 heard, RRR. No JVD, murmurs, rubs, gallops or clicks. No pedal edema. Gastrointestinal system: Abdomen is nondistended, soft and nontender. No organomegaly or masses felt. Normal bowel sounds heard. Central nervous system: Alert and oriented only to self. No focal neurological deficits. Extremities: Spontaneously moves all limbs without apparent weakness  Skin: No rashes, lesions or ulcers Psychiatry: Judgement and insight appear impaired. Mood & affect appropriate.     Data Reviewed: I have personally reviewed following labs and imaging studies  CBC:  Recent Labs Lab 12/20/15 1623 12/21/15 0329  WBC 11.0* 8.1  NEUTROABS 8.6*  --   HGB 7.5* 8.4*  HCT 22.8* 25.5*  MCV 97.4 95.5  PLT 243 215   Basic Metabolic Panel:  Recent Labs Lab 12/20/15 1623 12/21/15 0329  NA 138 138  K 2.5* 3.0*  CL 111 110  CO2 20* 21*  GLUCOSE 139* 100*  BUN 28* 22*  CREATININE 0.94 0.73  CALCIUM 8.4* 8.1*  MG  --  2.0   GFR: Estimated Creatinine Clearance: 58.1 mL/min (by C-G formula based on SCr of 0.8 mg/dL). Liver Function Tests:  Recent Labs Lab 12/20/15 1623  AST 41  ALT 25  ALKPHOS 61  BILITOT 0.5  PROT 6.3*  ALBUMIN 2.7*   No results for input(s): LIPASE, AMYLASE in the last 168 hours. No results for input(s): AMMONIA in the last 168 hours. Coagulation Profile: No results for input(s): INR, PROTIME in the last 168 hours. Cardiac Enzymes:  Recent Labs Lab 12/20/15 1623  TROPONINI 0.04*   BNP (last 3 results) No results for input(s): PROBNP in the last 8760 hours. HbA1C: No results for  input(s): HGBA1C in the last 72 hours. CBG:  Recent Labs Lab 12/20/15 1752  GLUCAP 126*   Lipid Profile: No results for input(s): CHOL, HDL, LDLCALC, TRIG, CHOLHDL, LDLDIRECT in the last 72 hours. Thyroid Function Tests: No results for input(s): TSH, T4TOTAL, FREET4, T3FREE, THYROIDAB in the last 72 hours. Anemia Panel:  Recent Labs  12/20/15 2141  VITAMINB12 533  FOLATE 12.6  FERRITIN 240  TIBC 263  IRON 28*  RETICCTPCT 3.2*    Sepsis Labs: No results for input(s): PROCALCITON, LATICACIDVEN in the last 168 hours.  Recent Results (from the past 240 hour(s))  Surgical pcr screen     Status: None   Collection Time: 12/21/15  9:04 AM  Result Value Ref Range Status   MRSA, PCR NEGATIVE NEGATIVE Final   Staphylococcus aureus NEGATIVE NEGATIVE Final    Comment:        The Xpert SA Assay (FDA approved for NASAL specimens in patients over 93 years of age),  is one component of a comprehensive surveillance program.  Test performance has been validated by [facility] for patients greater than or equal to 67 year old. It is not intended to diagnose infection nor to guide or monitor treatment.          Radiology Studies: Dg Chest 2 View  Result Date: 12/20/2015 CLINICAL DATA:  Syncope, recurrent episodes while being transported from bed to wheelchair in the rehab center he stays in, hx: CVA EXAM: CHEST  2 VIEW COMPARISON:  10/11/2015 FINDINGS: Cardiac silhouette is normal in size and configuration. No mediastinal or hilar masses or evidence of adenopathy. Multiple small calcifications noted in the left mid to lower lung, stable. Lungs hyperexpanded but otherwise clear. No pleural effusion or pneumothorax. Skeletal structures are demineralized but grossly intact. IMPRESSION: No acute cardiopulmonary disease. Stable appearance from the prior study. Electronically Signed   By: Amie Portland M.D.   On: 12/20/2015 17:11  Ct Abdomen Pelvis W Contrast  Result Date:  12/20/2015 CLINICAL DATA:  Blood in stool EXAM: CT ABDOMEN AND PELVIS WITH CONTRAST TECHNIQUE: Multidetector CT imaging of the abdomen and pelvis was performed using the standard protocol following bolus administration of intravenous contrast. CONTRAST:  100 mL ISOVUE-300 IOPAMIDOL (ISOVUE-300) INJECTION 61% COMPARISON:  None. FINDINGS: Lower chest: Lung bases demonstrate calcified pleural plaques without focal infiltrate. Hepatobiliary: No masses or other significant abnormality. Pancreas: No mass, inflammatory changes, or other significant abnormality. Spleen: Within normal limits in size and appearance. Adrenals/Urinary Tract: No masses identified. No evidence of hydronephrosis. Stomach/Bowel: The appendix is not well visualized although no inflammatory changes are seen. Scattered fecal material is noted throughout the colon. Diverticular changes noted without diverticulitis. No small bowel abnormality is seen. Vascular/Lymphatic: Diffuse aortic calcifications are noted without aneurysmal dilatation. No significant lymphadenopathy is noted. Reproductive: No mass or other significant abnormality. Other: The bladder is well distended but demonstrates some dependent density which may be related to blood or debris. Correlation with today's urinalysis is recommended. Musculoskeletal: Left hip replacement is noted. No acute bony abnormality is seen. Degenerative changes of the lumbar spine as well as postoperative changes of the lumbar spine are seen. Chronic compression deformity at L3 is noted. IMPRESSION: Dependent density is noted within the bladder which may be related to debris or possible blood products. Correlation with today's urinalysis is recommended. No focal abnormality to correspond with the patient's given clinical history is seen. The appendix is not visualized although no inflammatory changes are noted. No other acute abnormality is noted. Electronically Signed   By: Alcide Clever M.D.   On: 12/20/2015  22:48       Scheduled Meds: . acetaminophen  1,000 mg Oral BID  . antiseptic oral rinse  7 mL Mouth Rinse BID  . aspirin EC  81 mg Oral Daily  . atorvastatin  10 mg Oral QHS  . baclofen  10 mg Oral TID  . citalopram  10 mg Oral Daily  . docusate sodium  100 mg Oral Daily  . feeding supplement (ENSURE ENLIVE)  237 mL Oral BID BM  . pantoprazole  40 mg Oral Q breakfast  . potassium chloride      . potassium chloride  40 mEq Oral BID  . QUEtiapine  25 mg Oral QHS  . tamsulosin  0.4 mg Oral QHS  . topiramate  50 mg Oral BID   Continuous Infusions:    LOS: 1 day    Time spent: 60 minutes.    Marcellus Scott, MD [facility]  Pager [phone]  If 7PM-7AM, please contact night-coverage [email or web address] Password [facility] 12/21/2015, 12:46 PM

## 2015-12-21 NOTE — Evaluation (Signed)
Physical Therapy Evaluation Patient Details Name: Randall Hoffman MRN: [id number] DOB: 28-Mar-1935 Today's Date: 12/21/2015   History of Present Illness  Patient is a 81 y/o male with hx of Left THA and revision 10/13/15, CVA, seizure disorder, HLD, depression, Hep C presents from SNF due to recurrent syncope. In ED, Hemoccult positive, HGB 7.5  Clinical Impression  Patient presents with decreased AROM/strength BLEs limiting knee flexion to ~70 degrees, pain, confusion and limited mobility. Pt from SNF and not forthcoming with information. Based on findings, pt most likely requires assist with ADLs and uses w/c for mobility. Pt easily irritated during session. Will attempt squat pivot transfer to chair next session as tolerated. Encouraged exercise while in bed. Recommend return to SNF to maximize mobility and independence. Will follow.     Follow Up Recommendations SNF    Equipment Recommendations  None recommended by PT    Recommendations for Other Services       Precautions / Restrictions Precautions Precautions: Fall Restrictions Weight Bearing Restrictions: No      Mobility  Bed Mobility Overal bed mobility: Needs Assistance Bed Mobility: Supine to Sit;Sit to Supine     Supine to sit: HOB elevated;Min assist Sit to supine: Mod assist   General bed mobility comments: Assist to scoot bottom to EOB but able to bring BLEs to EOB without assist, heavy use of rail for support/assist. Assist to bring LEs into bed to return to supine.  Transfers Overall transfer level: Needs assistance Equipment used: Rolling walker (2 wheeled) Transfers: Sit to/from Stand Sit to Stand: Total assist;+2 physical assistance         General transfer comment: Attempted to stand but pt not able to flex hips/knee or place feet on floor to establish a BoS and with posterior lean, pushing backwards. Setting up to plan squat pivot with 2nd person but pt tired of waiting and demanded to lay  down.  Ambulation/Gait                Stairs            Wheelchair Mobility    Modified Rankin (Stroke Patients Only)       Balance Overall balance assessment: Needs assistance Sitting-balance support: Feet supported;Bilateral upper extremity supported Sitting balance-Leahy Scale: Poor Sitting balance - Comments: Requires BUE support sitting EOB due to trunk extension/posterior lean and limited knee flexion. Sat EOB ~10 minutes. Postural control: Posterior lean Standing balance support: During functional activity Standing balance-Leahy Scale: Zero                               Pertinent Vitals/Pain Pain Assessment: Faces Faces Pain Scale: Hurts little more Pain Location: back Pain Descriptors / Indicators: Aching Pain Intervention(s): Monitored during session;Repositioned;Limited activity within patient's tolerance    Home Living Family/patient expects to be discharged to:: Skilled nursing facility                 Additional Comments: At SNF for rehab. Mr. Konrath not forthcoming with information.     Prior Function Level of Independence: Needs assistance         Comments: Pt not a great historian and get easily irritated when asked questions. "You are confusing me, everyone in here confuses me!" Reports ambulation with RW and using mainly w/c for mobility but based on findings, pt seems to be non ambulatory.     Hand Dominance   Dominant Hand: Right  Extremity/Trunk Assessment   Upper Extremity Assessment: Defer to OT evaluation           Lower Extremity Assessment: RLE deficits/detail;LLE deficits/detail;Difficult to assess due to impaired cognition RLE Deficits / Details: Able to perform SLR but limited knee flexion actively and passively to ~80 degrees. Bil foot drop LLE Deficits / Details: Able to perform SLR but limited knee flexion actively and passively to ~70 degrees. Bil foot drop     Communication    Communication: No difficulties  Cognition Arousal/Alertness: Awake/alert Behavior During Therapy: WFL for tasks assessed/performed Overall Cognitive Status: Impaired/Different from baseline Area of Impairment: Orientation;Safety/judgement;Awareness;Memory;Problem solving Orientation Level: Disoriented to;Time;Situation (Pt thinks it is night time- all lights off and blinds closed upon entering.)   Memory: Decreased short-term memory   Safety/Judgement: Decreased awareness of safety;Decreased awareness of deficits Awareness: Intellectual (pre- intelletual?) Problem Solving: Slow processing;Decreased initiation;Requires verbal cues;Difficulty sequencing      General Comments      Exercises General Exercises - Lower Extremity Straight Leg Raises: Both;10 reps;Supine      Assessment/Plan    PT Assessment Patient needs continued PT services  PT Diagnosis Difficulty walking;Altered mental status;Abnormality of gait;Generalized weakness   PT Problem List Decreased strength;Decreased mobility;Decreased safety awareness;Decreased range of motion;Decreased activity tolerance;Decreased cognition;Decreased balance;Pain  PT Treatment Interventions Functional mobility training;Balance training;Wheelchair mobility training;Patient/family education;Therapeutic exercise;DME instruction;Therapeutic activities;Gait training   PT Goals (Current goals can be found in the Care Plan section) Acute Rehab PT Goals Patient Stated Goal: none stated PT Goal Formulation: With patient Time For Goal Achievement: 01/04/16 Potential to Achieve Goals: Fair    Frequency Min 2X/week   Barriers to discharge        Co-evaluation               End of Session Equipment Utilized During Treatment: Gait belt Activity Tolerance: Patient tolerated treatment well;Other (comment) (irritation fluctuates) Patient left: in bed;with call bell/phone within reach;with nursing/sitter in room;with bed alarm set Nurse  Communication: Mobility status;Need for lift equipment         Time: 1610-9604 PT Time Calculation (min) (ACUTE ONLY): 27 min   Charges:   PT Evaluation $PT Eval Moderate Complexity: 1 Procedure PT Treatments $Therapeutic Activity: 8-22 mins   PT G Codes:        Abran Gavigan A Ryli Standlee 12/21/2015, 11:53 AM Mylo Red, PT, DPT [phone]

## 2015-12-21 NOTE — Consult Note (Signed)
Consultation Note Date: 12/21/2015   Patient Name: Randall Hoffman  DOB: 12/31/1934  MRN: [id number]  Age / Sex: 81 y.o., male  PCP: No Pcp Per Patient Referring Physician: Elease Etienne, MD  Reason for Consultation: Establishing goals of care  HPI/Patient Profile: 81 y.o. male  with past medical history of dementia, anemia, depression, GERD, hepatitis C, HLD, protein calorie malnutrition and seizure disorder, hospitalized 10/11/15-10/16/15 for left displaced femoral neck fracture and underwent total hip arthroplasty, sent to [facility] ED from SNF on 12/20/15 after a syncopal episode that lasted 1-5 minutes when patient was being transferred to hospital bed. He apparently has lost 50 pounds over the last couple of months.   Clinical Assessment and Goals of Care: I spoke today with Mr. Gloeckner (confused and unable to make decisions). So I spoke more with his family (daughter and wife) about his current status and a plan moving forward. They are very clear about their desire to pursue comfort and not more labs or diagnostics. They understand that his QOL is extremely poor and he has been rapidly declining over the past 2-3 months. They understand prognosis is very poor.   They say he has been complaining of nausea and chronic back pain and has not really had any intake for 3 days. I do believe that he approaching end of life. We did discuss hospice facility vs return SNF with hospice care. Will assess over the next day and make recommendations. Return to SNF with hospice may be the best option.   All questions/concerns addressed. Emotional support provided. Will add comfort meds.   NEXT OF KIN wife and daughter make decisions together (legally his wife)    SUMMARY OF RECOMMENDATIONS   Focus on comfort care Hospice facility vs SNF with hospice  Code Status/Advance Care Planning:  DNR   Symptom Management:    Nausea: Ondansetron prn.   Pain: COntinue Vicodin prn. Morphine prn severe pain.   Agitation (he was quite agitated when I saw him): Haldol prn.   Palliative Prophylaxis:   Bowel Regimen, Delirium Protocol and Frequent Pain Assessment  Additional Recommendations (Limitations, Scope, Preferences):  Avoid Hospitalization, Full Comfort Care, Minimize Medications, No Artificial Feeding, No Blood Transfusions, No Diagnostics and No Surgical Procedures  Psycho-social/Spiritual:   Desire for further Chaplaincy support:no  Additional Recommendations: Caregiving  Support/Resources and Education on Hospice  Prognosis:   Prognosis very poor and likely only weeks or less with rapid decline in functional status and intake.   Discharge Planning: Hospice facility vs SNF with hospice      Primary Diagnoses: Present on Admission: . Symptomatic anemia . Occult blood positive stool . Unintentional weight loss   I have reviewed the medical record, interviewed the patient and family, and examined the patient. The following aspects are pertinent.  Past Medical History:  Diagnosis Date  . Acute blood loss anemia   . BPH (benign prostatic hyperplasia)   . Chronic depression   . Constipation   . ESBL (extended spectrum beta-lactamase) producing bacteria infection   .  Femur fracture, left (HCC)   . GERD (gastroesophageal reflux disease)   . Hepatitis C   . History of CVA (cerebrovascular accident)   . HLD (hyperlipidemia)   . Hyponatremia   . Lack of coordination   . Muscle weakness (generalized)   . Osteoporosis   . Protein calorie malnutrition (HCC)   . Seizure disorder (HCC)   . Stroke (HCC)   . Unsteadiness on feet    Social History   Social History  . Marital status: Married    Spouse name: N/A  . Number of children: N/A  . Years of education: N/A   Social History Main Topics  . Smoking status: Former Games developer  . Smokeless tobacco: None  . Alcohol use No  . Drug  use: No  . Sexual activity: No   Other Topics Concern  . None   Social History Narrative  . None   Family History  Problem Relation Age of Onset  . Heart disease Other     unknown, patient says family members died long ago and he cannot remember   Scheduled Meds: . acetaminophen  1,000 mg Oral BID  . antiseptic oral rinse  7 mL Mouth Rinse BID  . baclofen  10 mg Oral TID  . citalopram  10 mg Oral Daily  . docusate sodium  100 mg Oral Daily  . feeding supplement  1 Container Oral TID BM  . pantoprazole  40 mg Oral Q breakfast  . potassium chloride  40 mEq Oral BID  . QUEtiapine  25 mg Oral QHS  . tamsulosin  0.4 mg Oral QHS  . topiramate  50 mg Oral BID   Continuous Infusions:  PRN Meds:.HYDROcodone-acetaminophen, methocarbamol Medications Prior to Admission:  Prior to Admission medications   Medication Sig Start Date End Date Taking? Authorizing Provider  acetaminophen (TYLENOL) 500 MG tablet Take 1,000 mg by mouth 2 (two) times daily.   Yes Historical Provider, MD  alendronate (FOSAMAX) 70 MG tablet Take 1 tablet (70 mg total) by mouth once a week. Take with a full glass of water on an empty stomach. 10/13/15  Yes Lora Paula, MD  aspirin EC 81 MG tablet Take 81 mg by mouth daily.   Yes Historical Provider, MD  atorvastatin (LIPITOR) 10 MG tablet Take 10 mg by mouth daily.    Yes Historical Provider, MD  baclofen (LIORESAL) 10 MG tablet Take 10 mg by mouth 3 (three) times daily.   Yes Historical Provider, MD  citalopram (CELEXA) 10 MG tablet Take 10 mg by mouth daily.   Yes Historical Provider, MD  docusate sodium (COLACE) 100 MG capsule Take 100 mg by mouth daily.   Yes Historical Provider, MD  hydrocerin (EUCERIN) CREA Apply 1 application topically daily. To bilateral legs and feet   Yes Historical Provider, MD  HYDROcodone-acetaminophen (NORCO/VICODIN) 5-325 MG tablet Take 1-2 tablets by mouth every 6 (six) hours as needed for moderate pain. 11/02/15  Yes Kirt Boys,  DO  methocarbamol (ROBAXIN) 500 MG tablet Take 1 tablet (500 mg total) by mouth every 6 (six) hours as needed for muscle spasms. 10/13/15  Yes Lora Paula, MD  pantoprazole (PROTONIX) 40 MG tablet Take 40 mg by mouth daily.   Yes Historical Provider, MD  QUEtiapine (SEROQUEL) 25 MG tablet Take 25 mg by mouth at bedtime.   Yes Historical Provider, MD  tamsulosin (FLOMAX) 0.4 MG CAPS capsule Take 0.4 mg by mouth daily.    Yes Historical Provider, MD  topiramate (TOPAMAX)  50 MG tablet Take 50 mg by mouth 2 (two) times daily.   Yes Historical Provider, MD  UNABLE TO FIND Med Name: Med pass 120 mL by mouth TID   Yes Historical Provider, MD   Allergies  Allergen Reactions  . Penicillins Rash    Note patient had ancef on 10/11/15 with no reported complications   Review of Systems  Unable to tell - AMS. Does endorse back pain.   Physical Exam  Alert, disoriented, no distress Pulm: Breathing normal, regular, room air, no labored breathing Card: RRR Abd: Normal appearance, soft, NT  Vital Signs: BP 123/72 (BP Location: Right Arm)   Pulse 94   Temp 97.6 F (36.4 C) (Oral)   Resp 20   Ht 5\' 8"  (1.727 m)   Wt 54.9 kg (121 lb)   SpO2 100%   BMI 18.40 kg/m  Pain Assessment: 0-10   Pain Score: 2    SpO2: SpO2: 100 % O2 Device:SpO2: 100 % O2 Flow Rate: .   IO: Intake/output summary:  Intake/Output Summary (Last 24 hours) at 12/21/15 1712 Last data filed at 12/21/15 0949  Gross per 24 hour  Intake             1535 ml  Output                0 ml  Net             1535 ml    LBM: Last BM Date: 12/20/15 Baseline Weight: Weight: 54.9 kg (121 lb) Most recent weight: Weight: 54.9 kg (121 lb)     Palliative Assessment/Data: PPS: 20%     Time In: 1430 Time Out: 1530 Time Total: Greater than 50%  of this time was spent counseling and coordinating care related to the above assessment and plan.  Signed by: Ulice Bold, NP   Please contact Palliative Medicine Team  phone at [phone] for questions and concerns.  For individual provider: See Loretha Stapler

## 2015-12-21 NOTE — Care Management Important Message (Signed)
Important Message  Patient Details  Name: Randall Hoffman MRN: [id number] Date of Birth: 1935-01-27   Medicare Important Message Given:  Yes    Bernadette Hoit 12/21/2015, 10:56 AM

## 2015-12-21 NOTE — Progress Notes (Signed)
Initial Nutrition Assessment  DOCUMENTATION CODES:   Severe malnutrition in context of chronic illness, Underweight  INTERVENTION:   -D/c Ensure Enlive po BID, each supplement provides 350 kcal and 20 grams of protein, due to poor acceptance -Boost Breeze po TID, each supplement provides 250 kcal and 9 grams of protein  NUTRITION DIAGNOSIS:   Malnutrition related to chronic illness as evidenced by severe depletion of muscle mass, severe depletion of body fat, percent weight loss.  GOAL:   Patient will meet greater than or equal to 90% of their needs  MONITOR:   PO intake, Supplement acceptance, Labs, Weight trends, Skin, I & O's  REASON FOR ASSESSMENT:   Malnutrition Screening Tool, Consult Assessment of nutrition requirement/status  ASSESSMENT:   Randall Hoffman is a 81 y.o. male with medical history significant of stroke, femur fracture a couple of months ago, has not been doing very well since the femur fracture (basically bed bound now).  DNR at baseline.  He is sent in from his rehab today after a syncopal episode.  LOC lasted 1-5 mins.  Occurred in context of transferring to a hospital bed.  Pt admitted with symptomatic anemia. Per H&P, CT of abdomen and pelvis ordered. Per MD notes, initial work-up worrisome for underlying malignant neoplastic issue in GI tract.   Pt refused most of nutrition assessment and interview. He expressed to this RD "Yes, I've lost weight, but I'll be fine. Don't worry about it", despite RD explaining role and purpose for visit.   Abbreviated Nutrition-Focused physical exam completed, due to pt refusal of most RD assessment. Findings are severe fat depletion and severe muscle depletion.   Wt hx reviewed. Noted pt has experienced a 12.3% wt loss over the past 2 months, which is significant for time frame.   Per MD notes, pt has been refusing to eat for several months. Per doc flowsheets, pt consumed 0% of breakfast and is refusing Ensure  supplements.   Palliative care consult pending.   Labs reviewed: K: 3.0 (on PO supplementation).   Diet Order:  Diet regular Room service appropriate? Yes; Fluid consistency: Thin  Skin:  Reviewed, no issues  Last BM:  12/20/15  Height:   Ht Readings from Last 1 Encounters:  12/20/15 5\' 8"  (1.727 m)    Weight:   Wt Readings from Last 1 Encounters:  12/20/15 121 lb (54.9 kg)    Ideal Body Weight:  70 kg  BMI:  Body mass index is 18.4 kg/m.  Estimated Nutritional Needs:   Kcal:  1650-1850  Protein:  85-100 grams  Fluid:  1.6-1.8 L  EDUCATION NEEDS:   No education needs identified at this time  Amelia Macken A. Mayford Knife, RD, LDN, CDE Pager: [phone] After hours Pager: [phone]

## 2015-12-22 DIAGNOSIS — R627 Adult failure to thrive: Secondary | ICD-10-CM

## 2015-12-22 DIAGNOSIS — F0390 Unspecified dementia without behavioral disturbance: Secondary | ICD-10-CM

## 2015-12-22 DIAGNOSIS — Z66 Do not resuscitate: Secondary | ICD-10-CM

## 2015-12-22 DIAGNOSIS — E43 Unspecified severe protein-calorie malnutrition: Secondary | ICD-10-CM

## 2015-12-22 MED ORDER — BOOST / RESOURCE BREEZE PO LIQD: 1.0000 | Freq: Three times a day (TID) | ORAL | Status: DC

## 2015-12-22 MED ORDER — SENNA 8.6 MG PO TABS: 1.0000 | ORAL_TABLET | Freq: Two times a day (BID) | ORAL | Status: DC | PRN

## 2015-12-22 MED ORDER — ONDANSETRON HCL 4 MG PO TABS: 4.0000 mg | ORAL_TABLET | Freq: Two times a day (BID) | ORAL | Status: DC

## 2015-12-22 MED ORDER — OXYCODONE HCL 5 MG PO TABS: 5.0000 mg | ORAL_TABLET | Freq: Four times a day (QID) | ORAL | Status: DC | PRN

## 2015-12-22 MED ORDER — OXYCODONE HCL 5 MG PO TABS: 5.0000 mg | ORAL_TABLET | Freq: Four times a day (QID) | ORAL | 0 refills | Status: DC | PRN

## 2015-12-22 MED ORDER — ONDANSETRON HCL 4 MG PO TABS: 4.0000 mg | ORAL_TABLET | Freq: Two times a day (BID) | ORAL | Status: AC

## 2015-12-22 NOTE — Clinical Social Work Note (Signed)
Pt discharged to [facility] via [facility]. Pt family notified and SNF contacted regarding pt discharging. Nurse also informed to give report

## 2015-12-22 NOTE — Progress Notes (Signed)
Pt discharged to [facility] via [facility]. Paperwork with [facility].  Pt belongings sent with patient. Lupita Leash, daughter aware pt is being transported to [place].   Report called and given to Lurena Joiner, RN.

## 2015-12-22 NOTE — Discharge Instructions (Signed)
Failure to Thrive, Adult Failure to thrive is a group of symptoms that affect elderly adults. These symptoms include loss of appetite and weight loss. People who have this condition may do fewer and fewer activities over time. They may lose interest in being with friends or they may not want to eat or drink. This condition is not a normal part of aging. CAUSES This condition may be caused by:  A disease, such dementia, diabetes, cancer, or lung disease.  A health problem, such as a vitamin deficiency or a heart problem.  A disorder, such as depression.  A disability.  Medicines.  Mistreatment or neglect. In some cases, the cause may not be known. SYMPTOMS Symptoms of this condition include:  Loss of more than 5% of your body weight.  Being more tired than normal after an activity.  Having trouble getting up after sitting.  Loss of appetite.  Not getting out of bed.  Not wanting to do usual activities.  Depression.  Getting infections often.  Bedsores.  Taking a long time to recover after an injury or a surgery.  Weakness. DIAGNOSIS This condition may be diagnosed with a physical exam. Your health care provider will ask questions about your health, behavior, and mood, such as:  Has your activity changed?  Do you seem sad?  Are your eating habits different? Tests may also be done. They may include:  Blood tests.  Urine tests.  Imaging tests, such as X-rays, a CT scan, or MRI.  Hearing tests.  Vision tests.  Tests to check thinking ability (cognitive tests).  Activity tests to see if you can do tasks such as bathing and dressing and to see if you can move around safely. You may be referred to a specialist. TREATMENT Treatment for this condition depends on the cause. It may involve:  Treating the cause.  Talk therapy or medicine to treat depression.  Improving diet, such as by eating more often or taking nutritional supplements.  Changing or  stopping a medicine.  Physical therapy. It often takes a team of health care providers to find the right treatment. HOME CARE INSTRUCTIONS  Take over-the-counter and prescription medicines only as told by your health care provider.  Eat a healthy, well-balanced diet. Make sure to get enough calories in each meal.  Be physically active. Include strength training as part of your exercise routine. A physical therapist can help to set up an exercise program that fits you.  Make sure that you are safe at home.  Make sure that you have a plan for what to do if you become unable to make decisions for yourself. SEEK MEDICAL CARE IF:  You are not able to eat well.  You are not able to move around.  You feel very sad or hopeless. SEEK IMMEDIATE MEDICAL CARE IF:  You have thoughts of ending your life.  You cannot eat or drink.  You do not get out of bed.  Staying at home is no longer safe.  You have a fever.   This information is not intended to replace advice given to you by your health care provider. Make sure you discuss any questions you have with your health care provider.   Document Released: 08/04/2011 Document Revised: 01/31/2015 Document Reviewed: 08/07/2014 Elsevier Interactive Patient Education 2016 [employer]  

## 2015-12-22 NOTE — Progress Notes (Signed)
Daily Progress Note   Patient Name: Randall Hoffman       Date: 12/22/2015 DOB: 04-Mar-1935  Age: 81 y.o. MRN#: [id number] Attending Physician: Elease Etienne, MD Primary Care Physician: No PCP Per Patient Admit Date: 12/20/2015  Reason for Consultation/Follow-up: Hospice Evaluation, Non pain symptom management, Pain control and Psychosocial/spiritual support  Subjective: Patient is alert. He appears withdrawn, has the room dark. He is complaining of back pain and chronic nausea. He does endorse that he would like to eat more but he is fearful of throwing up  Length of Stay: 1  Current Medications: Scheduled Meds:  . acetaminophen  1,000 mg Oral BID  . antiseptic oral rinse  7 mL Mouth Rinse BID  . baclofen  10 mg Oral TID  . citalopram  10 mg Oral Daily  . docusate sodium  100 mg Oral Daily  . feeding supplement  1 Container Oral TID BM  . ondansetron  4 mg Oral Q12H  . pantoprazole  40 mg Oral Q breakfast  . QUEtiapine  25 mg Oral QHS  . tamsulosin  0.4 mg Oral QHS  . topiramate  50 mg Oral BID    Continuous Infusions:    PRN Meds: haloperidol lactate, methocarbamol, ondansetron (ZOFRAN) IV, oxyCODONE, senna  Physical Exam  Constitutional: He is oriented to person, place, and time.  Cachetic frail  HENT:  Head: Normocephalic and atraumatic.  Neck: Normal range of motion.  Pulmonary/Chest: Effort normal.  Musculoskeletal: Normal range of motion.  Neurological: He is alert and oriented to person, place, and time.  Skin: Skin is warm and dry.  Psychiatric: His behavior is normal. Judgment and thought content normal.  Affect constricted  Nursing note and vitals reviewed.           Vital Signs: BP 119/75 (BP Location: Right Arm)   Pulse 98   Temp 98.6 F (37 C)  (Oral)   Resp 18   Ht  (1.727 m)   Wt 54.9 kg (121 lb)   SpO2 100%   BMI 18.40 kg/m  SpO2: SpO2: 100 % O2 Device: O2 Device: Not Delivered O2 Flow Rate:    Intake/output summary:  Intake/Output Summary (Last 24 hours) at 12/22/15 1201 Last data filed at 12/22/15 0930  Gross per 24 hour  Intake  120 ml  Output              400 ml  Net             -280 ml   LBM: Last BM Date: 12/21/15 Baseline Weight: Weight: 54.9 kg (121 lb) Most recent weight: Weight: 54.9 kg (121 lb)       Palliative Assessment/Data:      Patient Active Problem List   Diagnosis Date Noted  . Protein-calorie malnutrition, severe 12/21/2015  . Palliative care encounter   . DNR (do not resuscitate)   . Poor appetite   . Symptomatic anemia 12/20/2015  . Occult blood positive stool 12/20/2015  . Unintentional weight loss 12/20/2015  . Atherosclerosis of aorta (HCC) 10/12/2015  . Left displaced femoral neck fracture (HCC) 10/11/2015  . Seizure disorder (HCC) 10/11/2015  . Hepatitis C 10/11/2015  . History of stroke in prior 3 months 10/11/2015    Palliative Care Assessment & Plan   Patient Profile: Patient and his wife lived in [place] until May 2017. He was hospitalized in [place] on 08/30/2015 when his wife found him on the floor. He was diagnosed to have strokes 2. He has been bedbound since then. Daughter went to visit them and decided [place] 10[place]10[place]15 and he was in an assisted facility for a day where he sustained a fall and fractured his hip. Hospitalized at [facility] for 5 days and underwent hip fracture surgery. Discharged to [facility] where he was for approximately 2 weeks for rehabilitation and then transferred to [facility] where he has been since. Prior to initial hospitalization in [place], patient had been gradually declining, ambulating with the help of a walker, having falls, difficulty moving around, leg edema and was on diuretics for same. Had memory  deficits. Since 3 months, rapid decline in mental status but current mental status is not significantly different than his baseline over 3 months. Apparently had an episode of black stools while at [facility] and told to have blood in stools. Subsequently transferred to [facility] and apparently not followed up. No reports of further black stools, blood in stools or bleeding elsewhere. Had EGD remotely in [place]-reports not known. Never had colonoscopy. She was told by [facility] that while he was being transferred from wheelchair to bed on day of admission, he passed out and his blood pressure was 79/50 but no other details available.  Discussed extensively regarding treatment options including aggressive care which would involve GI consultation, possible upper and lower endoscopies, frequent lab draws, transfuse as needed, replace thyroxine, dietitian consultation-may make recommendations but would not change if patient continues to refuse to eat as he has done for several months. This versus palliative care option and transitioning to comfort oriented care, no further lab draws, aggressive intervention/evaluations or blood transfusions. Lupita Leash indicated that she had been discussing this with her mother and wishes to pursue palliative route and she was very clear about this. Requested palliative care consultation.   Recommendations/Plan:  Pain: We'll DC Vicodin. Patient states it's not very effective. Patient has chronic pain and has tried oxycodone with better results. We'll start oxycodone 5-10 mg every 4 hours as needed  Nausea: Patient is complaining of nausea. He has not vomited but is fearful of eating that he may vomit. We'll schedule Zofran  Goals of Care and Additional Recommendations:  Limitations on Scope of Treatment: Avoid Hospitalization, No Artificial Feeding, No Chemotherapy, No Hemodialysis, No Lab Draws, No  Radiation, No Surgical Procedures and No Tracheostomy  Code  Status:    Code Status Orders        Start     Ordered   12/20/15 2129  Do not attempt resuscitation (DNR)  Continuous    Question Answer Comment  In the event of cardiac or respiratory ARREST Do not call a "code blue"   In the event of cardiac or respiratory ARREST Do not perform Intubation, CPR, defibrillation or ACLS   In the event of cardiac or respiratory ARREST Use medication by any route, position, wound care, and other measures to relive pain and suffering. May use oxygen, suction and manual treatment of airway obstruction as needed for comfort.      12/20/15 2129    Code Status History    Date Active Date Inactive Code Status Order ID Comments User Context   10/11/2015  9:22 AM 10/16/2015  8:53 PM DNR [id number]  Yolanda Manges, DO ED    Advance Directive Documentation   Flowsheet Row Most Recent Value  Type of Advance Directive  Out of facility DNR (pink MOST or yellow form)  Pre-existing out of facility DNR order (yellow form or pink MOST form)  No data  "MOST" Form in Place?  No data       Prognosis:   Weeks in the setting of sharp functional decline since May after his hip fracture, protein calorie malnutrition with albumin of 2.7, BMI of 18 and a 12.3% weight loss over a period of 2 months; patient's clinical condition worrisome for underlying neoplasm he is no longer walking.   Discharge Planning:  Skilled Nursing Facility with Hospice   Thank you for allowing the Palliative Medicine Team to assist in the care of this patient.   Time In: 1000 Time Out: 1025 Total Time 25 min Prolonged Time Billed  no       Greater than 50%  of this time was spent counseling and coordinating care related to the above assessment and plan.  Irean Hong, NP  Please contact Palliative Medicine Team phone at [phone] for questions and concerns.

## 2015-12-22 NOTE — Clinical Social Work Note (Signed)
Clinical Social Work Assessment  Patient Details  Name: Randall Hoffman MRN: [id number] Date of Birth: 1935/03/15  Date of referral:  12/22/15               Reason for consult:  Discharge Planning                Permission sought to share information with:  Facility Sport and exercise psychologist, Family Supports Permission granted to share information::  No  Name::        Agency::   (SNF)  Relationship::     Contact Information:     Housing/Transportation Living arrangements for the past 2 months:  [place] of Information:  Adult Children Patient Interpreter Needed:  None Criminal Activity/Legal Involvement Pertinent to Current Situation/Hospitalization:  No - Comment as needed Significant Relationships:  Adult Children Lives with:  Facility Resident Do you feel safe going back to the place where you live?  No Need for family participation in patient care:  Yes (Comment)  Care giving concerns: Pt confused and unable to participate in assessment. CSW spoke with pt family via phone. Pt family agreeable to SNF for higher level of care.    Social Worker assessment / plan: CSW met with pt to discuss CSW role with discharge planning. Pt confused and unable to participate in assessment. CSW spoke with pt's daughter via phone. Pt's daughter stated she wants pt to return to SNF at [facility].    Employment status:  Retired Forensic scientist:  Other (Comment Required) ([employer] ) PT Recommendations:  [place] / Referral to community resources:  [place]  Patient/Family's Response to care: Pt unable to participate in assessment. Pt family appears happy with care pt is receiving at [facility].   Patient/Family's Understanding of and Emotional Response to Diagnosis, Current Treatment, and Prognosis: Pt's daughter appears to have an understanding of reason for pt admission into the hospital and pt care plan.    Emotional Assessment Appearance:  Appears stated age Attitude/Demeanor/Rapport:  Combative, Guarded Affect (typically observed):  Guarded Orientation:  Oriented to Self Alcohol / Substance use:  Not Applicable Psych involvement (Current and /or in the community):     Discharge Needs  Concerns to be addressed:  Discharge Planning Concerns Readmission within the last 30 days:    Current discharge risk:  None Barriers to Discharge:  Continued Medical Work up   [employer], LCSW 12/22/2015, 1:28 PM

## 2015-12-22 NOTE — NC FL2 (Signed)
[place] MEDICAID FL2 LEVEL OF CARE SCREENING TOOL     IDENTIFICATION  Patient Name: Randall Hoffman Birthdate: September 01, 1934 Sex: male Admission Date (Current Location): 12/20/2015  [facility] and [place] Number:  Producer, television/film/video and Address:  The . [facility], [address] [place], [place] [address]      Provider Number: [id number]  Attending Physician Name and Address:  No att. providers found  Relative Name and Phone Number:       Current Level of Care: Hospital Recommended Level of Care: Skilled Nursing Facility Prior Approval Number:    Date Approved/Denied:   PASRR Number:  [id number]  Discharge Plan: SNF    Current Diagnoses: Patient Active Problem List   Diagnosis Date Noted  . Protein-calorie malnutrition, severe 12/21/2015  . Palliative care encounter   . DNR (do not resuscitate)   . Poor appetite   . Symptomatic anemia 12/20/2015  . Occult blood positive stool 12/20/2015  . Unintentional weight loss 12/20/2015  . Atherosclerosis of aorta (HCC) 10/12/2015  . Left displaced femoral neck fracture (HCC) 10/11/2015  . Seizure disorder (HCC) 10/11/2015  . Hepatitis C 10/11/2015  . History of stroke in prior 3 months 10/11/2015    Orientation RESPIRATION BLADDER Height & Weight     Self  Normal Incontinent Weight: 121 lb (54.9 kg) Height:  5\' 8"  (172.7 cm)  BEHAVIORAL SYMPTOMS/MOOD NEUROLOGICAL BOWEL NUTRITION STATUS   (None)  (Seizure disorder (HCC) and history of Stroke ) Incontinent Diet (Regular)  AMBULATORY STATUS COMMUNICATION OF NEEDS Skin   Extensive Assist Verbally                         Personal Care Assistance Level of Assistance  Bathing, Feeding, Dressing Bathing Assistance: Limited assistance Feeding assistance: Independent Dressing Assistance: Limited assistance     Functional Limitations Info  Sight, Hearing, Speech Sight Info: Adequate Hearing Info: Adequate Speech Info: Adequate    SPECIAL CARE  FACTORS FREQUENCY  PT (By licensed PT)     PT Frequency:  (5x/week)              Contractures Contractures Info: Not present    Additional Factors Info  Code Status, Allergies Code Status Info: (S)  (DNR) Allergies Info:  (Penicillins)           Current Medications (12/22/2015):  This is the current hospital active medication list Current Facility-Administered Medications  Medication Dose Route Frequency Provider Last Rate Last Dose  . acetaminophen (TYLENOL) tablet 1,000 mg  1,000 mg Oral BID Elease Etienne, MD   1,000 mg at 12/21/15 1112  . antiseptic oral rinse (CPC / CETYLPYRIDINIUM CHLORIDE 0.05%) solution 7 mL  7 mL Mouth Rinse BID Hillary Bow, DO   7 mL at 12/21/15 2235  . baclofen (LIORESAL) tablet 10 mg  10 mg Oral TID Elease Etienne, MD   10 mg at 12/22/15 1022  . citalopram (CELEXA) tablet 10 mg  10 mg Oral Daily Elease Etienne, MD   10 mg at 12/22/15 1022  . docusate sodium (COLACE) capsule 100 mg  100 mg Oral Daily Elease Etienne, MD   100 mg at 12/22/15 1022  . feeding supplement (BOOST / RESOURCE BREEZE) liquid 1 Container  1 Container Oral TID BM Elease Etienne, MD      . haloperidol lactate (HALDOL) injection 1 mg  1 mg Intravenous Q6H PRN Ulice Bold, NP      .  methocarbamol (ROBAXIN) tablet 500 mg  500 mg Oral Q6H PRN Elease Etienne, MD      . ondansetron [facility]) injection 4 mg  4 mg Intravenous Q6H PRN Ulice Bold, NP      . ondansetron [facility]) tablet 4 mg  4 mg Oral Q12H Irean Hong, NP      . oxyCODONE (Oxy IR/ROXICODONE) immediate release tablet 5-10 mg  5-10 mg Oral Q6H PRN Irean Hong, NP      . pantoprazole (PROTONIX) EC tablet 40 mg  40 mg Oral Q breakfast Elease Etienne, MD      . QUEtiapine (SEROQUEL) tablet 25 mg  25 mg Oral QHS Elease Etienne, MD   25 mg at 12/21/15 2234  . senna (SENOKOT) tablet 8.6 mg  1 tablet Oral BID PRN Ulice Bold, NP      . tamsulosin (FLOMAX) capsule 0.4 mg  0.4 mg Oral QHS  Elease Etienne, MD   0.4 mg at 12/21/15 2234  . topiramate (TOPAMAX) tablet 50 mg  50 mg Oral BID Elease Etienne, MD   50 mg at 12/22/15 1022   Current Outpatient Prescriptions  Medication Sig Dispense Refill  . acetaminophen (TYLENOL) 500 MG tablet Take 1,000 mg by mouth 2 (two) times daily.    Marland Kitchen alendronate (FOSAMAX) 70 MG tablet Take 1 tablet (70 mg total) by mouth once a week. Take with a full glass of water on an empty stomach.    . baclofen (LIORESAL) 10 MG tablet Take 10 mg by mouth 3 (three) times daily.    . citalopram (CELEXA) 10 MG tablet Take 10 mg by mouth daily.    Marland Kitchen docusate sodium (COLACE) 100 MG capsule Take 100 mg by mouth daily.    . hydrocerin (EUCERIN) CREA Apply 1 application topically daily. To bilateral legs and feet    . methocarbamol (ROBAXIN) 500 MG tablet Take 1 tablet (500 mg total) by mouth every 6 (six) hours as needed for muscle spasms. 15 tablet 0  . pantoprazole (PROTONIX) 40 MG tablet Take 40 mg by mouth daily.    . QUEtiapine (SEROQUEL) 25 MG tablet Take 25 mg by mouth at bedtime.    . tamsulosin (FLOMAX) 0.4 MG CAPS capsule Take 0.4 mg by mouth daily.     Marland Kitchen topiramate (TOPAMAX) 50 MG tablet Take 50 mg by mouth 2 (two) times daily.    . feeding supplement (BOOST / RESOURCE BREEZE) LIQD Take 1 Container by mouth 3 (three) times daily between meals.    . ondansetron (ZOFRAN) 4 MG tablet Take 1 tablet (4 mg total) by mouth 2 (two) times daily. And may use 4 mg every 12 hours when necessary for nausea or vomiting.    Marland Kitchen oxyCODONE (OXY IR/ROXICODONE) 5 MG immediate release tablet Take 1-2 tablets (5-10 mg total) by mouth every 6 (six) hours as needed for moderate pain or severe pain. 30 tablet 0  . senna (SENOKOT) 8.6 MG TABS tablet Take 1 tablet (8.6 mg total) by mouth 2 (two) times daily as needed for mild constipation.       Discharge Medications: Please see discharge summary for a list of discharge medications.  Relevant Imaging Results:  Relevant Lab  Results:   Additional Information (S) SS#:   Terald Sleeper, LCSW

## 2015-12-22 NOTE — Discharge Summary (Signed)
Physician Discharge Summary  Randall Hoffman JQB:[id number] DOB: 1935/03/25  PCP: No PCP Per Patient  Admit date: 12/20/2015 Discharge date: 12/22/2015  Admitted From: [facility]. Disposition:  [facility] with Hospice.  Recommendations for Outpatient Follow-up:  1. MD at SNF in 3 days.  Home Health: None Equipment/Devices: None    Discharge Condition: Guarded and risk for decline and death.  CODE STATUS: DO NOT RESUSCITATE.  Diet recommendation: Regular diet.  Discharge Diagnoses:  Principal Problem:   Symptomatic anemia Active Problems:   Occult blood positive stool   Unintentional weight loss   Protein-calorie malnutrition, severe   Palliative care encounter   DNR (do not resuscitate)   Poor appetite   Brief/Interim Summary: 81 year old male, SNF resident, PMH of dementia, anemia, depression, GERD, hepatitis C, HLD, protein calorie malnutrition and seizure disorder, hospitalized 10/11/15-10/16/15 for left displaced femoral neck fracture and underwent total hip arthroplasty, apparently has not been doing very well since the hospitalization (basically bedbound), sent to [facility] ED from SNF on 12/20/15 after a syncopal episode that lasted 1-5 minutes when patient was being transferred to hospital bed. He apparently has lost 50 pounds over the last couple of months. No history obtainable from patient secondary to mental status change.  Additional history from daughter, Ms. Randall Hoffman  Patient and his wife lived in [place] until May 2017. He was hospitalized in [place] on 08/30/2015 when his wife found him on the floor. He was diagnosed to have strokes 2. He has been bedbound since then. Daughter went to visit them and decided [place] 10[place]10[place]15 and he was in an assisted facility for a day where he sustained a fall and fractured his hip. Hospitalized at [facility] for 5 days and underwent hip fracture surgery. Discharged to [facility] where he was for  approximately 2 weeks for rehabilitation and then transferred to [facility] where he has been since. Prior to initial hospitalization in [place], patient had been gradually declining, ambulating with the help of a walker, having falls, difficulty moving around, leg edema and was on diuretics for same. Had memory deficits. Since 3 months, rapid decline in mental status but current mental status is not significantly different than his baseline over 3 months. Apparently had an episode of black stools while at [facility] and told to have blood in stools. Subsequently transferred to [facility] and apparently not followed up. No reports of further black stools, blood in stools or bleeding elsewhere. Had EGD remotely in [place]-reports not known. Never had colonoscopy. She was told by [facility] that while he was being transferred from wheelchair to bed on day of admission, he passed out and his blood pressure was 79/50 but no other details available.  Discussed extensively regarding treatment options including aggressive care which would involve GI consultation, possible upper and lower endoscopies, frequent lab draws, transfuse as needed, replace thyroxine, dietitian consultation-may make recommendations but would not change if patient continues to refuse to eat as he has done for several months. This versus palliative care option and transitioning to comfort oriented care, no further lab draws, aggressive intervention/evaluations or blood transfusions. [name] indicated that she had been discussing this with her mother and wishes to pursue palliative route and she was very clear about this. Requested palliative care consultation  Assessment and plan:  Syncope - Details surrounding the episode are not available. Patient unable to provide history.  - No evaluation: Transitioned to full comfort care.  Normocytic anemia -  Anemia panel: Iron 28, TIBC 263, saturation ratios 11, ferritin 240, folate 1.6, B12:  533 and reticulocytes and 76.2. Suggests chronic disease. - FOBT +. No reports of overt GI bleed. Patient unable to tell whether he's had a GI workup/colonoscopy done. - Hemoglobin in the10.8-11.2 range in June. Dropped from 11.2 on 6/27 > 7.5 on admission 7/26. - Status post 1 unit PRBC transfusion on admission. Hemoglobin improved to 8.4. - CT abdomen and pelvis without acute findings. - History of acute blood loss anemia in the past.  - No further evaluation or management. Transition to comfort care. Given concern for GI bleed, anemia, possible upper GI issues such as gastritis, esophagitis, PUD (patient having nausea)-aspirin discontinued by palliative care team.  Hypokalemia  Hypothyroidism - TFTs per recent PCP visit 7/19 (TSH 14.8, free T4: 0.9 and free T3: 2.6). Prior to that TSH: 23.47 on 11/19/15 - Transitioned to full comfort care. Discussed with daughter today regarding option to start low-dose Synthroid to see if it would help him, may improve overall well-being and appetite. She however wishes to not and many new medications and pursue full comfort.  Profound weight loss - Unclear etiology. Probably multifactorial from poor oral intake complicating advanced dementia among other possibilities. CT abdomen without acute abnormality or suggestion of malignancy. No further evaluation for reasons indicated above.  Advanced dementia - Mental status unchanged for the last 3 months. CT head approximately 2 months ago negative for acute findings.  Seizure disorder - Continue topiramate.  History of CVA - Aspirin discontinued this admission.  GERD - Continue Protonix.  Left femoral neck fracture status post arthroplasty - Patient returns to SNF.  Depression - Difficult to evaluate in the context of confusion. Continue Celexa and Remeron.  BPH - Continue Flomax.  Constipation - Continue Colace. Senna added.  Hyperlipidemia - Continue Lipitor.  Hepatitis C -  As per discharge summary on 10/15/15, untreated due to financial/insurance constraints with affording [facility]. Had been referred to [facility].  Osteoporosis  Former smoker  Adult failure to thrive - Multifactorial related to advanced age, frail physical state, multiple significant comorbidities, advanced dementia. Palliative care was consulted and met with patient and family who were clear about their desire to pursue comfort and not further aggressive evaluation or management. They understand his prognosis is very poor. As per palliative follow-up today, pain medications have been changed from Vicodin to oxycodone which has worked better for him in the past and have added scheduled Zofran for nausea. Patient will be discharged to SNF with hospice today. Discussed with daughter Ms. Randall Hoffman who is in agreement.  Consultants:   Palliative care team  Procedures:   None   Discharge Instructions  Discharge Instructions    Call MD for:  difficulty breathing, headache or visual disturbances    Complete by:  As directed   Call MD for:  extreme fatigue    Complete by:  As directed   Call MD for:  persistant dizziness or light-headedness    Complete by:  As directed   Call MD for:  persistant nausea and vomiting    Complete by:  As directed   Call MD for:  severe uncontrolled pain    Complete by:  As directed   Call MD for:  temperature >100.4    Complete by:  As directed   Diet general    Complete by:  As directed   Increase activity slowly    Complete by:  As directed  Medication List    STOP taking these medications   aspirin EC 81 MG tablet   atorvastatin 10 MG tablet Commonly known as:  LIPITOR   HYDROcodone-acetaminophen 5-325 MG tablet Commonly known as:  NORCO/VICODIN   UNABLE TO FIND     TAKE these medications   acetaminophen 500 MG tablet Commonly known as:  TYLENOL Take 1,000 mg by mouth 2 (two) times daily.   alendronate 70 MG tablet Commonly known as:   FOSAMAX Take 1 tablet (70 mg total) by mouth once a week. Take with a full glass of water on an empty stomach.   baclofen 10 MG tablet Commonly known as:  LIORESAL Take 10 mg by mouth 3 (three) times daily.   citalopram 10 MG tablet Commonly known as:  CELEXA Take 10 mg by mouth daily.   docusate sodium 100 MG capsule Commonly known as:  COLACE Take 100 mg by mouth daily.   feeding supplement Liqd Take 1 Container by mouth 3 (three) times daily between meals.   hydrocerin Crea Apply 1 application topically daily. To bilateral legs and feet   methocarbamol 500 MG tablet Commonly known as:  ROBAXIN Take 1 tablet (500 mg total) by mouth every 6 (six) hours as needed for muscle spasms.   ondansetron 4 MG tablet Commonly known as:  ZOFRAN Take 1 tablet (4 mg total) by mouth 2 (two) times daily. And may use 4 mg every 12 hours when necessary for nausea or vomiting.   oxyCODONE 5 MG immediate release tablet Commonly known as:  Oxy IR/ROXICODONE Take 1-2 tablets (5-10 mg total) by mouth every 6 (six) hours as needed for moderate pain or severe pain.   pantoprazole 40 MG tablet Commonly known as:  PROTONIX Take 40 mg by mouth daily.   QUEtiapine 25 MG tablet Commonly known as:  SEROQUEL Take 25 mg by mouth at bedtime.   senna 8.6 MG Tabs tablet Commonly known as:  SENOKOT Take 1 tablet (8.6 mg total) by mouth 2 (two) times daily as needed for mild constipation.   tamsulosin 0.4 MG Caps capsule Commonly known as:  FLOMAX Take 0.4 mg by mouth daily.   topiramate 50 MG tablet Commonly known as:  TOPAMAX Take 50 mg by mouth 2 (two) times daily.      Follow-up Information    MD at SNF. Schedule an appointment as soon as possible for a visit in 3 day(s).          Allergies  Allergen Reactions  . Penicillins Rash    Note patient had ancef on 10/11/15 with no reported complications     Procedures/Studies: Dg Chest 2 View  Result Date: 12/20/2015 CLINICAL DATA:   Syncope, recurrent episodes while being transported from bed to wheelchair in the rehab center he stays in, hx: CVA EXAM: CHEST  2 VIEW COMPARISON:  10/11/2015 FINDINGS: Cardiac silhouette is normal in size and configuration. No mediastinal or hilar masses or evidence of adenopathy. Multiple small calcifications noted in the left mid to lower lung, stable. Lungs hyperexpanded but otherwise clear. No pleural effusion or pneumothorax. Skeletal structures are demineralized but grossly intact. IMPRESSION: No acute cardiopulmonary disease. Stable appearance from the prior study. Electronically Signed   By: Lajean Manes M.D.   On: 12/20/2015 17:11  Ct Abdomen Pelvis W Contrast  Result Date: 12/20/2015 CLINICAL DATA:  Blood in stool EXAM: CT ABDOMEN AND PELVIS WITH CONTRAST TECHNIQUE: Multidetector CT imaging of the abdomen and pelvis was performed using the standard protocol  following bolus administration of intravenous contrast. CONTRAST:  100 mL ISOVUE-300 IOPAMIDOL (ISOVUE-300) INJECTION 61% COMPARISON:  None. FINDINGS: Lower chest: Lung bases demonstrate calcified pleural plaques without focal infiltrate. Hepatobiliary: No masses or other significant abnormality. Pancreas: No mass, inflammatory changes, or other significant abnormality. Spleen: Within normal limits in size and appearance. Adrenals/Urinary Tract: No masses identified. No evidence of hydronephrosis. Stomach/Bowel: The appendix is not well visualized although no inflammatory changes are seen. Scattered fecal material is noted throughout the colon. Diverticular changes noted without diverticulitis. No small bowel abnormality is seen. Vascular/Lymphatic: Diffuse aortic calcifications are noted without aneurysmal dilatation. No significant lymphadenopathy is noted. Reproductive: No mass or other significant abnormality. Other: The bladder is well distended but demonstrates some dependent density which may be related to blood or debris. Correlation with  today's urinalysis is recommended. Musculoskeletal: Left hip replacement is noted. No acute bony abnormality is seen. Degenerative changes of the lumbar spine as well as postoperative changes of the lumbar spine are seen. Chronic compression deformity at L3 is noted. IMPRESSION: Dependent density is noted within the bladder which may be related to debris or possible blood products. Correlation with today's urinalysis is recommended. No focal abnormality to correspond with the patient's given clinical history is seen. The appendix is not visualized although no inflammatory changes are noted. No other acute abnormality is noted. Electronically Signed   By: Inez Catalina M.D.   On: 12/20/2015 22:48     Subjective: Pleasantly confused and refuses to answer questions appropriately. As per RN, no acute issues.  Discharge Exam:  Vitals:   12/21/15 0407 12/21/15 1533 12/21/15 2147 12/22/15 0504  BP: 118/71 123/72 (!) 148/86 119/75  Pulse: 100 94 90 98  Resp: 19 20 20 18   Temp: 98.4 F (36.9 C) 97.6 F (36.4 C) 98.1 F (36.7 C) 98.6 F (37 C)  TempSrc: Oral Oral Oral Oral  SpO2: 97% 100% 99% 100%  Weight:      Height:        General exam: Moderately built, chronically ill-looking, frail elderly male, lying comfortably propped up in bed, appears unkempt has a towel in hand and doing something with it that is not clear.  Respiratory system: Clear to auscultation. Respiratory effort normal. Cardiovascular system: S1 & S2 heard, RRR. No JVD, murmurs, rubs, gallops or clicks. No pedal edema. Gastrointestinal system: Abdomen is nondistended, soft and nontender. No organomegaly or masses felt. Normal bowel sounds heard. Central nervous system: Alert and oriented only to self. No focal neurological deficits. Extremities: Spontaneously moves all limbs without apparent weakness  Skin: No rashes, lesions or ulcers Psychiatry: Judgement and insight appear impaired. Mood & affect -irritable.    The  results of significant diagnostics from this hospitalization (including imaging, microbiology, ancillary and laboratory) are listed below for reference.     Microbiology: Recent Results (from the past 240 hour(s))  Surgical pcr screen     Status: None   Collection Time: 12/21/15  9:04 AM  Result Value Ref Range Status   MRSA, PCR NEGATIVE NEGATIVE Final   Staphylococcus aureus NEGATIVE NEGATIVE Final    Comment:        The Xpert SA Assay (FDA approved for NASAL specimens in patients over 59 years of age), is one component of a comprehensive surveillance program.  Test performance has been validated by [facility] for patients greater than or equal to 85 year old. It is not intended to diagnose infection nor to guide or monitor treatment.  Labs: BNP (last 3 results) No results for input(s): BNP in the last 8760 hours. Basic Metabolic Panel:  Recent Labs Lab 12/20/15 1623 12/21/15 0329  NA 138 138  K 2.5* 3.0*  CL 111 110  CO2 20* 21*  GLUCOSE 139* 100*  BUN 28* 22*  CREATININE 0.94 0.73  CALCIUM 8.4* 8.1*  MG  --  2.0   Liver Function Tests:  Recent Labs Lab 12/20/15 1623  AST 41  ALT 25  ALKPHOS 61  BILITOT 0.5  PROT 6.3*  ALBUMIN 2.7*   No results for input(s): LIPASE, AMYLASE in the last 168 hours. No results for input(s): AMMONIA in the last 168 hours. CBC:  Recent Labs Lab 12/20/15 1623 12/21/15 0329  WBC 11.0* 8.1  NEUTROABS 8.6*  --   HGB 7.5* 8.4*  HCT 22.8* 25.5*  MCV 97.4 95.5  PLT 243 215   Cardiac Enzymes:  Recent Labs Lab 12/20/15 1623  TROPONINI 0.04*   BNP: Invalid input(s): POCBNP CBG:  Recent Labs Lab 12/20/15 1752  GLUCAP 126*   D-Dimer  Recent Labs  12/20/15 1623  DDIMER 0.60*   Hgb A1c No results for input(s): HGBA1C in the last 72 hours. Lipid Profile No results for input(s): CHOL, HDL, LDLCALC, TRIG, CHOLHDL, LDLDIRECT in the last 72 hours. Thyroid function studies No results for input(s): TSH,  T4TOTAL, T3FREE, THYROIDAB in the last 72 hours.  Invalid input(s): FREET3 Anemia work up  Recent Labs  12/20/15 2141  VITAMINB12 533  FOLATE 12.6  FERRITIN 240  TIBC 263  IRON 28*  RETICCTPCT 3.2*   Urinalysis    Component Value Date/Time   COLORURINE AMBER (A) 10/12/2015 0153   APPEARANCEUR CLEAR 10/12/2015 0153   LABSPEC 1.022 10/12/2015 0153   PHURINE 6.0 10/12/2015 0153   GLUCOSEU NEGATIVE 10/12/2015 0153   HGBUR NEGATIVE 10/12/2015 0153   BILIRUBINUR NEGATIVE 10/12/2015 0153   KETONESUR 15 (A) 10/12/2015 0153   PROTEINUR NEGATIVE 10/12/2015 0153   NITRITE NEGATIVE 10/12/2015 0153   LEUKOCYTESUR SMALL (A) 10/12/2015 0153   Sepsis Labs Invalid input(s): PROCALCITONIN,  WBC,  LACTICIDVEN    Time coordinating discharge: Over 30 minutes  SIGNED:  Vernell Leep, MD, FACP, FHM. [facility] Pager [phone]  If 7PM-7AM, please contact night-coverage [email or web address] Password [facility] 12/22/2015, 12:42 PM

## 2015-12-24 ENCOUNTER — Encounter: Payer: Self-pay | Admitting: Adult Health

## 2015-12-24 ENCOUNTER — Non-Acute Institutional Stay (SKILLED_NURSING_FACILITY): Payer: Medicare Other | Admitting: Adult Health

## 2015-12-24 DIAGNOSIS — N400 Enlarged prostate without lower urinary tract symptoms: Secondary | ICD-10-CM

## 2015-12-24 DIAGNOSIS — D649 Anemia, unspecified: Secondary | ICD-10-CM | POA: Diagnosis not present

## 2015-12-24 DIAGNOSIS — K922 Gastrointestinal hemorrhage, unspecified: Secondary | ICD-10-CM | POA: Diagnosis not present

## 2015-12-24 DIAGNOSIS — F329 Major depressive disorder, single episode, unspecified: Secondary | ICD-10-CM

## 2015-12-24 DIAGNOSIS — S72002S Fracture of unspecified part of neck of left femur, sequela: Secondary | ICD-10-CM

## 2015-12-24 DIAGNOSIS — E876 Hypokalemia: Secondary | ICD-10-CM

## 2015-12-24 DIAGNOSIS — R5381 Other malaise: Secondary | ICD-10-CM | POA: Diagnosis not present

## 2015-12-24 DIAGNOSIS — E039 Hypothyroidism, unspecified: Secondary | ICD-10-CM

## 2015-12-24 DIAGNOSIS — E038 Other specified hypothyroidism: Secondary | ICD-10-CM | POA: Diagnosis not present

## 2015-12-24 DIAGNOSIS — K5900 Constipation, unspecified: Secondary | ICD-10-CM | POA: Diagnosis not present

## 2015-12-24 DIAGNOSIS — E43 Unspecified severe protein-calorie malnutrition: Secondary | ICD-10-CM | POA: Diagnosis not present

## 2015-12-24 DIAGNOSIS — M810 Age-related osteoporosis without current pathological fracture: Secondary | ICD-10-CM

## 2015-12-24 DIAGNOSIS — G40909 Epilepsy, unspecified, not intractable, without status epilepticus: Secondary | ICD-10-CM

## 2015-12-24 NOTE — Progress Notes (Signed)
Patient ID: Randall Hoffman, male   DOB: 12-27-1934, 81 y.o.   MRN: [id number]    DATE:  12/24/2015   MRN:  [id number]  BIRTHDAY: April 27, 1935  Facility:  Nursing Home Location:  [facility]  Nursing Home Room Number: 407-P  LEVEL OF CARE:  SNF (31)  Contact Information    Name Relation Home Work Mobile   [place] Daughter [phone]     Deigo, Alonso [phone]         Code Status History    Date Active Date Inactive Code Status Order ID Comments User Context   12/20/2015  9:29 PM 12/22/2015  5:33 PM DNR [id number]  Hillary Bow, DO ED   10/11/2015  9:22 AM 10/16/2015  8:53 PM DNR [id number]  Yolanda Manges, DO ED    Questions for Most Recent Historical Code Status (Order [id number])    Question Answer Comment   In the event of cardiac or respiratory ARREST Do not call a "code blue"    In the event of cardiac or respiratory ARREST Do not perform Intubation, CPR, defibrillation or ACLS    In the event of cardiac or respiratory ARREST Use medication by any route, position, wound care, and other measures to relive pain and suffering. May use oxygen, suction and manual treatment of airway obstruction as needed for comfort.         Advance Directive Documentation   Flowsheet Row Most Recent Value  Type of Advance Directive  Out of facility DNR (pink MOST or yellow form)  Pre-existing out of facility DNR order (yellow form or pink MOST form)  Yellow form placed in chart (order not valid for inpatient use)  "MOST" Form in Place?  No data       Chief Complaint  Patient presents with  . Hospitalization Follow-up    HISTORY OF PRESENT ILLNESS:  This is a 81 year old male who has been re-admitted to [facility] on 12/22/15 from [facility]. He was having rehabilitation @ [facility] for a recent He left hip arthroplasty done in Oct 11, 2015.  He had a syncopal episode and was transferred to the hospital.  He was diagnosed with normocytic  anemia. FOBT was + with no reports of overt bleeding. Hgb was 7.5 and had transfusion of 1 unit PRBC. Hgb improved to 8.4. CT of abdomen and pelvis without acute findings. Family whishes to pursue palliative care and no further evaluation or management.   PAST MEDICAL HISTORY:  Past Medical History:  Diagnosis Date  . Acute blood loss anemia   . BPH (benign prostatic hyperplasia)   . Chronic depression   . Constipation   . ESBL (extended spectrum beta-lactamase) producing bacteria infection   . Femur fracture, left (HCC)   . GERD (gastroesophageal reflux disease)   . Hepatitis C   . History of CVA (cerebrovascular accident)   . HLD (hyperlipidemia)   . Hyponatremia   . Lack of coordination   . Muscle weakness (generalized)   . Osteoporosis   . Protein calorie malnutrition (HCC)   . Seizure disorder (HCC)   . Stroke (HCC)   . Unsteadiness on feet      CURRENT MEDICATIONS: Reviewed  Patient's Medications  New Prescriptions   No medications on file  Previous Medications   ACETAMINOPHEN (TYLENOL) 500 MG TABLET    Take 1,000 mg by mouth 2 (two) times daily.   ALENDRONATE (FOSAMAX) 70 MG TABLET    Take 1 tablet (70 mg total)  by mouth once a week. Take with a full glass of water on an empty stomach.   BACLOFEN (LIORESAL) 10 MG TABLET    Take 10 mg by mouth 3 (three) times daily.   CITALOPRAM (CELEXA) 10 MG TABLET    Take 10 mg by mouth daily.   DOCUSATE SODIUM (COLACE) 100 MG CAPSULE    Take 100 mg by mouth daily.   FEEDING SUPPLEMENT (BOOST / RESOURCE BREEZE) LIQD    Take 1 Container by mouth 3 (three) times daily between meals.   HYDROCERIN (EUCERIN) CREA    Apply 1 application topically daily. To bilateral legs and feet   METHOCARBAMOL (ROBAXIN) 500 MG TABLET    Take 1 tablet (500 mg total) by mouth every 6 (six) hours as needed for muscle spasms.   ONDANSETRON (ZOFRAN) 4 MG TABLET    Take 1 tablet (4 mg total) by mouth 2 (two) times daily. And may use 4 mg every 12 hours when  necessary for nausea or vomiting.   OXYCODONE (OXY IR/ROXICODONE) 5 MG IMMEDIATE RELEASE TABLET    Take 1-2 tablets (5-10 mg total) by mouth every 6 (six) hours as needed for moderate pain or severe pain.   PANTOPRAZOLE (PROTONIX) 40 MG TABLET    Take 40 mg by mouth daily.   QUETIAPINE (SEROQUEL) 25 MG TABLET    Take 25 mg by mouth at bedtime.   SENNA (SENOKOT) 8.6 MG TABS TABLET    Take 1 tablet (8.6 mg total) by mouth 2 (two) times daily as needed for mild constipation.   TAMSULOSIN (FLOMAX) 0.4 MG CAPS CAPSULE    Take 0.4 mg by mouth daily.    TOPIRAMATE (TOPAMAX) 50 MG TABLET    Take 50 mg by mouth 2 (two) times daily.  Modified Medications   No medications on file  Discontinued Medications   No medications on file     Allergies  Allergen Reactions  . Penicillins Rash    Note patient had ancef on 10/11/15 with no reported complications     REVIEW OF SYSTEMS:  GENERAL: no change in appetite, no fatigue, no weight changes, no fever, chills or weakness EYES: Denies change in vision, dry eyes, eye pain, itching or discharge EARS: Denies change in hearing, ringing in ears, or earache NOSE: Denies nasal congestion or epistaxis MOUTH and THROAT: Denies oral discomfort, gingival pain or bleeding, pain from teeth or hoarseness   RESPIRATORY: no cough, SOB, DOE, wheezing, hemoptysis CARDIAC: no chest pain, edema or palpitations GI: no abdominal pain, diarrhea, constipation, heart burn, nausea or vomiting GU: Denies dysuria, frequency, hematuria, incontinence, or discharge PSYCHIATRIC: Denies feeling of depression or anxiety. No report of hallucinations, insomnia, paranoia   PHYSICAL EXAMINATION  GENERAL APPEARANCE: Well nourished. In no acute distress.  SKIN:  Skin is warm and dry.  HEAD: Normal in size and contour. No evidence of trauma EYES: Lids open and close normally. No blepharitis, entropion or ectropion. PERRL. Conjunctivae are clear and sclerae are white. Lenses are without  opacity EARS: Pinnae are normal. Patient hears normal voice tunes of the examiner MOUTH and THROAT: Lips are without lesions. Oral mucosa is moist and without lesions. Tongue is normal in shape, size, and color and without lesions NECK: supple, trachea midline, no neck masses, no thyroid tenderness, no thyromegaly LYMPHATICS: no LAN in the neck, no supraclavicular LAN RESPIRATORY: breathing is even & unlabored, BS CTAB CARDIAC: RRR, no murmur,no extra heart sounds, no edema GI: abdomen soft, normal BS, no masses, no  tenderness, no hepatomegaly, no splenomegaly EXTREMITIES:  Able to move X 4 extremities PSYCHIATRIC: Alert and oriented to person. Agitated.  LABS/RADIOLOGY: Labs reviewed: Basic Metabolic Panel:  Recent Labs  54/27/06 0304  11/20/15 0219  11/20/15 0559  12/13/15 12/20/15 1623 12/21/15 0329  NA 130*  < > 136  < > 138  < > 145 138 138  K 3.8  < > 2.2*  < > 3.0*  < > 4.1 2.5* 3.0*  CL 101  --  106  < > 104  --   --  111 110  CO2 22  --  24  --   --   --   --  20* 21*  GLUCOSE 90  --  101*  < > 93  --   --  139* 100*  BUN 11  < > 12  < > 8  < > 17 28* 22*  CREATININE 0.90  < > 0.56*  < > 0.50*  < > 0.8 0.94 0.73  CALCIUM 8.1*  --  7.7*  --   --   --   --  8.4* 8.1*  MG 1.9  --   --   --   --   --   --   --  2.0  < > = values in this interval not displayed. Liver Function Tests:  Recent Labs  10/11/15 0950 11/19/15 12/20/15 1623  AST 53* 36 41  ALT 42 17 25  ALKPHOS 76 75 61  BILITOT 1.3*  --  0.5  PROT 6.7  --  6.3*  ALBUMIN 3.1*  --  2.7*   CBC:  Recent Labs  11/20/15 0219  12/19/15 12/20/15 1623 12/21/15 0329  WBC 4.3  --  6.3 11.0* 8.1  NEUTROABS 1.8  --  3 8.6*  --   HGB 10.8*  < > 9.3* 7.5* 8.4*  HCT 31.1*  < > 30* 22.8* 25.5*  MCV 94.8  --   --  97.4 95.5  PLT 193  --  286 243 215  < > = values in this interval not displayed.  Lipid Panel:  Recent Labs  11/19/15  HDL 30*   Cardiac Enzymes:  Recent Labs  12/20/15 1623  TROPONINI  0.04*    CBG:  Recent Labs  12/20/15 1752  GLUCAP 126*      Dg Chest 2 View  Result Date: 12/20/2015 CLINICAL DATA:  Syncope, recurrent episodes while being transported from bed to wheelchair in the rehab center he stays in, hx: CVA EXAM: CHEST  2 VIEW COMPARISON:  10/11/2015 FINDINGS: Cardiac silhouette is normal in size and configuration. No mediastinal or hilar masses or evidence of adenopathy. Multiple small calcifications noted in the left mid to lower lung, stable. Lungs hyperexpanded but otherwise clear. No pleural effusion or pneumothorax. Skeletal structures are demineralized but grossly intact. IMPRESSION: No acute cardiopulmonary disease. Stable appearance from the prior study. Electronically Signed   By: Amie Portland M.D.   On: 12/20/2015 17:11  Ct Abdomen Pelvis W Contrast  Result Date: 12/20/2015 CLINICAL DATA:  Blood in stool EXAM: CT ABDOMEN AND PELVIS WITH CONTRAST TECHNIQUE: Multidetector CT imaging of the abdomen and pelvis was performed using the standard protocol following bolus administration of intravenous contrast. CONTRAST:  100 mL ISOVUE-300 IOPAMIDOL (ISOVUE-300) INJECTION 61% COMPARISON:  None. FINDINGS: Lower chest: Lung bases demonstrate calcified pleural plaques without focal infiltrate. Hepatobiliary: No masses or other significant abnormality. Pancreas: No mass, inflammatory changes, or other significant abnormality. Spleen: Within normal limits  in size and appearance. Adrenals/Urinary Tract: No masses identified. No evidence of hydronephrosis. Stomach/Bowel: The appendix is not well visualized although no inflammatory changes are seen. Scattered fecal material is noted throughout the colon. Diverticular changes noted without diverticulitis. No small bowel abnormality is seen. Vascular/Lymphatic: Diffuse aortic calcifications are noted without aneurysmal dilatation. No significant lymphadenopathy is noted. Reproductive: No mass or other significant abnormality.  Other: The bladder is well distended but demonstrates some dependent density which may be related to blood or debris. Correlation with today's urinalysis is recommended. Musculoskeletal: Left hip replacement is noted. No acute bony abnormality is seen. Degenerative changes of the lumbar spine as well as postoperative changes of the lumbar spine are seen. Chronic compression deformity at L3 is noted. IMPRESSION: Dependent density is noted within the bladder which may be related to debris or possible blood products. Correlation with today's urinalysis is recommended. No focal abnormality to correspond with the patient's given clinical history is seen. The appendix is not visualized although no inflammatory changes are noted. No other acute abnormality is noted. Electronically Signed   By: Alcide Clever M.D.   On: 12/20/2015 22:48   ASSESSMENT/PLAN:  Physical deconditioning - for rehabilitation, PT and OT  Normocytic anemia - S/P transfusion of 1 unit packed RBC; check CBC Lab Results  Component Value Date   HGB 8.4 (L) 12/21/2015   Left displaced femoral neck fracture S/P total hip arthroplasty  (10/11/15) - for rehabilitation, PT and OT; follow-up precautions; continue baclofen 10 mg 1 tab by mouth 3 times a day and Robaxin 500 mg 1 tab by mouth every 6 hours when necessary for muscle spasm; Roxicodone 5 mg 1-2 tabs by mouth every 6 hours when necessary and acetaminophen 500 mg take 2 tabs by mouth twice a day for pain; follow-up with orthopedics  Seizure disorder - continue topiramate 50 mg 1 tab by mouth twice a day  GI bleed - + FOBT; no further evaluation or management; for palliative consult with [facility]; continue Protonix 40 mg 1 tab by mouth daily  Major Depression - continue citalopram 10 mg 1 tab by mouth daily and Seroquel 25 mg 1 tab by mouth daily at bedtime  Constipation - continue Colace 100 mg 1 capsule by mouth daily and senna 8.6 mg 1 tab by mouth BID PRN  BPH - continue Flomax 0.4  mg 1 capsule by mouth daily   Subclinical Hypothyroidism - check tsh, freeT4 and free T3 on 01/14/16 Lab Results  Component Value Date   TSH 14.83 (A) 12/10/2015   Protein calorie malnutrition, severe - albumin 2.7; RD consult; continue med Pass 120 mL 3 times a day  Hypokalemia - check BMP Lab Results  Component Value Date   K 3.0 (L) 12/21/2015   Osteoporosis - continue Fosamax 70 mg by mouth weekly      Goals of care:  Long-term care/palliative care    Kenard Gower, NP [facility] [phone]

## 2015-12-25 ENCOUNTER — Non-Acute Institutional Stay (SKILLED_NURSING_FACILITY): Payer: Medicare Other | Admitting: Internal Medicine

## 2015-12-25 ENCOUNTER — Encounter: Payer: Self-pay | Admitting: Internal Medicine

## 2015-12-25 DIAGNOSIS — K922 Gastrointestinal hemorrhage, unspecified: Secondary | ICD-10-CM

## 2015-12-25 DIAGNOSIS — K5900 Constipation, unspecified: Secondary | ICD-10-CM | POA: Diagnosis not present

## 2015-12-25 DIAGNOSIS — F0390 Unspecified dementia without behavioral disturbance: Secondary | ICD-10-CM

## 2015-12-25 DIAGNOSIS — G40909 Epilepsy, unspecified, not intractable, without status epilepticus: Secondary | ICD-10-CM

## 2015-12-25 DIAGNOSIS — S72002S Fracture of unspecified part of neck of left femur, sequela: Secondary | ICD-10-CM | POA: Diagnosis not present

## 2015-12-25 DIAGNOSIS — D638 Anemia in other chronic diseases classified elsewhere: Secondary | ICD-10-CM

## 2015-12-25 DIAGNOSIS — F333 Major depressive disorder, recurrent, severe with psychotic symptoms: Secondary | ICD-10-CM | POA: Diagnosis not present

## 2015-12-25 DIAGNOSIS — Z8673 Personal history of transient ischemic attack (TIA), and cerebral infarction without residual deficits: Secondary | ICD-10-CM

## 2015-12-25 DIAGNOSIS — R5381 Other malaise: Secondary | ICD-10-CM | POA: Diagnosis not present

## 2015-12-25 DIAGNOSIS — E46 Unspecified protein-calorie malnutrition: Secondary | ICD-10-CM | POA: Diagnosis not present

## 2015-12-25 DIAGNOSIS — E039 Hypothyroidism, unspecified: Secondary | ICD-10-CM

## 2015-12-25 DIAGNOSIS — R627 Adult failure to thrive: Secondary | ICD-10-CM

## 2015-12-25 DIAGNOSIS — F03C0 Unspecified dementia, severe, without behavioral disturbance, psychotic disturbance, mood disturbance, and anxiety: Secondary | ICD-10-CM

## 2015-12-25 NOTE — Progress Notes (Signed)
LOCATION: [facility]  PCP: No PCP Per Patient   Code Status: DNR  Goals of care: Advanced Directive information Advanced Directives 12/24/2015  Does patient have an advance directive? Yes  Type of Advance Directive Out of facility DNR (pink MOST or yellow form)  Does patient want to make changes to advanced directive? No - Patient declined  Copy of advanced directive(s) in chart? Yes  Pre-existing out of facility DNR order (yellow form or pink MOST form) Yellow form placed in chart (order not valid for inpatient use)       Extended Emergency Contact Information Primary Emergency Contact: Romano,Donna Address: [address]          [place], [place] [address] [place] Home Phone: [phone] Relation: Daughter Secondary Emergency Contact: Bernestine Amass Address: [place] Home Phone: [phone] Relation: Spouse   Allergies  Allergen Reactions  . Penicillins Rash    Note patient had ancef on 10/11/15 with no reported complications    Chief Complaint  Patient presents with  . Readmit To SNF    Readmission     HPI:  Patient is a 81 y.o. male seen today for long term care post hospital re-admission from 12/20/15-12/22/15 with syncope and symptomatic anemia. His goal of care was reviewed and he is now under comfort care with palliative care team. He had 1 u prbc transfusion. His imaging study CT abdomen and pelvis and CXR was negative for acute abnormalities. He is seen in his room today. Per nursing staff, he has been refusing feed and medications at times. He has been calm this am.   Review of Systems:  Constitutional: Negative for fever, chills.  HENT: Negative for headache, congestion, sore throat, difficulty swallowing.   Eyes: Negative for blurred vision, double vision and discharge.  Respiratory: Negative for cough, shortness of breath and wheezing.   Cardiovascular: Negative for chest pain, palpitations, leg  swelling.  Gastrointestinal: Negative for heartburn, vomiting, constipation. Positive for some nausea and abdominal pain. Last bowel movement was yesterday. Genitourinary: positive for dysuria and flank pain Musculoskeletal: Negative for fall in the facility. Positive for back pain.  Skin: Negative for itching, rash.  Neurological: Positive for dizziness. Psychiatric/Behavioral: Negative for depression   Past Medical History:  Diagnosis Date  . Acute blood loss anemia   . BPH (benign prostatic hyperplasia)   . Chronic depression   . Constipation   . ESBL (extended spectrum beta-lactamase) producing bacteria infection   . Femur fracture, left (HCC)   . GERD (gastroesophageal reflux disease)   . Hepatitis C   . History of CVA (cerebrovascular accident)   . HLD (hyperlipidemia)   . Hyponatremia   . Lack of coordination   . Muscle weakness (generalized)   . Osteoporosis   . Protein calorie malnutrition (HCC)   . Seizure disorder (HCC)   . Stroke (HCC)   . Unsteadiness on feet    Past Surgical History:  Procedure Laterality Date  . TOTAL HIP ARTHROPLASTY Left 10/11/2015   Procedure: TOTAL HIP ARTHROPLASTY ANTERIOR APPROACH;  Surgeon: Kathryne Hitch, MD;  Location: [facility] OR;  Service: Orthopedics;  Laterality: Left;  . TOTAL HIP REVISION Left 10/13/2015   Procedure:   REVISION OF LEFT HIP BALL;  Surgeon: Kathryne Hitch, MD;  Location: [facility] OR;  Service: Orthopedics;  Laterality: Left;   Social History:   reports that he has quit smoking. He does not have any smokeless tobacco history on file. He reports  that he does not drink alcohol or use drugs.  Family History  Problem Relation Age of Onset  . Heart disease Other     unknown, patient says family members died long ago and he cannot remember    Medications:   Medication List       Accurate as of 12/25/15  2:29 PM. Always use your most recent med list.          acetaminophen 500 MG tablet Commonly known as:   TYLENOL Take 1,000 mg by mouth 2 (two) times daily.   alendronate 70 MG tablet Commonly known as:  FOSAMAX Take 1 tablet (70 mg total) by mouth once a week. Take with a full glass of water on an empty stomach.   baclofen 10 MG tablet Commonly known as:  LIORESAL Take 10 mg by mouth 3 (three) times daily.   citalopram 10 MG tablet Commonly known as:  CELEXA Take 10 mg by mouth daily.   docusate sodium 100 MG capsule Commonly known as:  COLACE Take 100 mg by mouth daily.   hydrocerin Crea Apply 1 application topically daily. To bilateral legs and feet   methocarbamol 500 MG tablet Commonly known as:  ROBAXIN Take 1 tablet (500 mg total) by mouth every 6 (six) hours as needed for muscle spasms.   ondansetron 4 MG tablet Commonly known as:  ZOFRAN Take 1 tablet (4 mg total) by mouth 2 (two) times daily. And may use 4 mg every 12 hours when necessary for nausea or vomiting.   oxyCODONE 5 MG immediate release tablet Commonly known as:  Oxy IR/ROXICODONE Take 1-2 tablets (5-10 mg total) by mouth every 6 (six) hours as needed for moderate pain or severe pain.   pantoprazole 40 MG tablet Commonly known as:  PROTONIX Take 40 mg by mouth daily.   QUEtiapine 25 MG tablet Commonly known as:  SEROQUEL Take 25 mg by mouth at bedtime.   senna 8.6 MG Tabs tablet Commonly known as:  SENOKOT Take 1 tablet (8.6 mg total) by mouth 2 (two) times daily as needed for mild constipation.   tamsulosin 0.4 MG Caps capsule Commonly known as:  FLOMAX Take 0.4 mg by mouth daily.   topiramate 50 MG tablet Commonly known as:  TOPAMAX Take 50 mg by mouth 2 (two) times daily.   UNABLE TO FIND Med Name: Med pass 120 mL by mouth 3 times daily       Immunizations:  There is no immunization history on file for this patient.   Physical Exam: Vitals:   12/25/15 1422  BP: 130/70  Pulse: 82  Resp: 18  Temp: 97.8 F (36.6 C)  TempSrc: Oral  SpO2: 95%  Weight: 118 lb 4.8 oz (53.7 kg)    Height: 5\' 8"  (1.727 m)   Body mass index is 17.99 kg/m.  General- elderly frail male, thin built, in no acute distress Head- normocephalic, atraumatic Nose- no nasal discharge Throat- moist mucus membrane, poor dentition Eyes- PERRLA, EOMI, no pallor, no icterus Neck- no cervical lymphadenopathy Cardiovascular- normal s1,s2, no murmur, no leg edema Respiratory- bilateral clear to auscultation, no wheeze, no rhonchi, no crackles, no use of accessory muscles Abdomen- bowel sounds present, soft, non tender Musculoskeletal- able to move all 4 extremities,generalized weakness Neurological- alert and oriented to person, place and time Skin- warm and dry Psychiatry- normal mood and affect    Labs reviewed: Basic Metabolic Panel:  Recent Labs  62/83/66 0304  11/20/15 0219  11/20/15 0559  12/20/15  12/20/15 1623 12/21/15 0329  NA 130*  < > 136  < > 138  < > 144 138 138  K 3.8  < > 2.2*  < > 3.0*  < > 3.0* 2.5* 3.0*  CL 101  --  106  < > 104  --   --  111 110  CO2 22  --  24  --   --   --   --  20* 21*  GLUCOSE 90  --  101*  < > 93  --   --  139* 100*  BUN 11  < > 12  < > 8  < > 29* 28* 22*  CREATININE 0.90  < > 0.56*  < > 0.50*  < > 0.6 0.94 0.73  CALCIUM 8.1*  --  7.7*  --   --   --   --  8.4* 8.1*  MG 1.9  --   --   --   --   --   --   --  2.0  < > = values in this interval not displayed. Liver Function Tests:  Recent Labs  10/11/15 0950 11/19/15 12/20/15 1623  AST 53* 36 41  ALT 42 17 25  ALKPHOS 76 75 61  BILITOT 1.3*  --  0.5  PROT 6.7  --  6.3*  ALBUMIN 3.1*  --  2.7*   No results for input(s): LIPASE, AMYLASE in the last 8760 hours. No results for input(s): AMMONIA in the last 8760 hours. CBC:  Recent Labs  11/20/15 0219  12/19/15 12/20/15 1623 12/21/15 0329  WBC 4.3  --  6.3 11.0* 8.1  NEUTROABS 1.8  --  3 8.6*  --   HGB 10.8*  < > 9.3* 7.5* 8.4*  HCT 31.1*  < > 30* 22.8* 25.5*  MCV 94.8  --   --  97.4 95.5  PLT 193  --  286 243 215  < > = values  in this interval not displayed. Cardiac Enzymes:  Recent Labs  12/20/15 1623  TROPONINI 0.04*   BNP: Invalid input(s): POCBNP CBG:  Recent Labs  12/20/15 1752  GLUCAP 126*    Radiological Exams: Dg Chest 2 View  Result Date: 12/20/2015 CLINICAL DATA:  Syncope, recurrent episodes while being transported from bed to wheelchair in the rehab center he stays in, hx: CVA EXAM: CHEST  2 VIEW COMPARISON:  10/11/2015 FINDINGS: Cardiac silhouette is normal in size and configuration. No mediastinal or hilar masses or evidence of adenopathy. Multiple small calcifications noted in the left mid to lower lung, stable. Lungs hyperexpanded but otherwise clear. No pleural effusion or pneumothorax. Skeletal structures are demineralized but grossly intact. IMPRESSION: No acute cardiopulmonary disease. Stable appearance from the prior study. Electronically Signed   By: Amie Portland M.D.   On: 12/20/2015 17:11  Ct Abdomen Pelvis W Contrast  Result Date: 12/20/2015 CLINICAL DATA:  Blood in stool EXAM: CT ABDOMEN AND PELVIS WITH CONTRAST TECHNIQUE: Multidetector CT imaging of the abdomen and pelvis was performed using the standard protocol following bolus administration of intravenous contrast. CONTRAST:  100 mL ISOVUE-300 IOPAMIDOL (ISOVUE-300) INJECTION 61% COMPARISON:  None. FINDINGS: Lower chest: Lung bases demonstrate calcified pleural plaques without focal infiltrate. Hepatobiliary: No masses or other significant abnormality. Pancreas: No mass, inflammatory changes, or other significant abnormality. Spleen: Within normal limits in size and appearance. Adrenals/Urinary Tract: No masses identified. No evidence of hydronephrosis. Stomach/Bowel: The appendix is not well visualized although no inflammatory changes are seen. Scattered fecal material is  noted throughout the colon. Diverticular changes noted without diverticulitis. No small bowel abnormality is seen. Vascular/Lymphatic: Diffuse aortic calcifications  are noted without aneurysmal dilatation. No significant lymphadenopathy is noted. Reproductive: No mass or other significant abnormality. Other: The bladder is well distended but demonstrates some dependent density which may be related to blood or debris. Correlation with today's urinalysis is recommended. Musculoskeletal: Left hip replacement is noted. No acute bony abnormality is seen. Degenerative changes of the lumbar spine as well as postoperative changes of the lumbar spine are seen. Chronic compression deformity at L3 is noted. IMPRESSION: Dependent density is noted within the bladder which may be related to debris or possible blood products. Correlation with today's urinalysis is recommended. No focal abnormality to correspond with the patient's given clinical history is seen. The appendix is not visualized although no inflammatory changes are noted. No other acute abnormality is noted. Electronically Signed   By: Alcide Clever M.D.   On: 12/20/2015 22:48   Assessment/Plan   Physical deconditioning Will have patient work with PT/OT as tolerated to regain strength and restore function.  Fall precautions are in place. With his overall poor prognosis with his non compliance to medications and his medical co-morbidities, he is to be seen by palliative care.  Failure to thrive With severe protein calorie malnutrition, treatment non compliance and ongoing weight loss with medical co-morbidities, get palliative care consult  Anemia of chronic disease S/p 1 u prbc transfusion. Monitor cbc  Gi bleed With positive stool test for blood. Continue protonix 40 mg daily and get palliative care consult with comfort being his goals of care. Off aspirin.   Depression with psychosis Non compliant with medication, diet and is rude and calling out to staff. Continue seorquel 25 mg daily with citalopram 10 mg daily. Has refused psychiatry follow up  Hypothyroidism Lab Results  Component Value Date   TSH  14.83 (A) 12/10/2015   not on any medication. Per d/c summary, daughter does not want medication treatment. Monitor clinically   Protein calorie malnutrition Monitor po intake and weight, decline anticipated  Seizure disorder Continue topiramate 50 mg bid and monitor  Advanced dementia Continue supportive care for now  Left displaced femoral neck fracture  S/P total hip arthroplasty in 5/17. Continue baclofen for muscle spasm and change robaxin to 500 mg q8h prn only. Continue roxicodone 5 mg 1-2 tab q6h prn pain with tylenol scheduled for pain and monitor  Constipation  continue senna and colace for now  History of CVA Off aspirin now, monitor clinically   Goals of care: long term care   Family/ staff Communication: reviewed care plan with patient and nursing supervisor    Oneal Grout, MD Internal Medicine [facility] [address] [place], [place] [address] Cell Phone (Monday-Friday 8 am - 5 pm): [phone] On Call: [phone] and follow prompts after 5 pm and on weekends Office Phone: [phone] Office Fax: [phone]

## 2016-01-14 LAB — TSH: TSH: 18.48 u[IU]/mL — AB (ref 0.41–5.90)

## 2016-01-18 NOTE — Progress Notes (Signed)
PT eval addendum- added G-codes    12/21/15 1156  PT G-Codes **NOT FOR INPATIENT CLASS**  Functional Assessment Tool Used clinical judgment  Functional Limitation Mobility: Walking and moving around  Mobility: Walking and Moving Around Current Status [phone](G8978) CN  Mobility: Walking and Moving Around Goal Status (U0454(G8979) CL    Mylo RedShauna Gared Gillie, PT, DPT [phone]

## 2016-01-29 ENCOUNTER — Encounter: Payer: Self-pay | Admitting: Adult Health

## 2016-01-29 ENCOUNTER — Non-Acute Institutional Stay (SKILLED_NURSING_FACILITY): Payer: Medicare Other | Admitting: Adult Health

## 2016-01-29 DIAGNOSIS — R112 Nausea with vomiting, unspecified: Secondary | ICD-10-CM

## 2016-01-29 DIAGNOSIS — K922 Gastrointestinal hemorrhage, unspecified: Secondary | ICD-10-CM

## 2016-01-29 DIAGNOSIS — D649 Anemia, unspecified: Secondary | ICD-10-CM | POA: Diagnosis not present

## 2016-01-29 DIAGNOSIS — E46 Unspecified protein-calorie malnutrition: Secondary | ICD-10-CM | POA: Diagnosis not present

## 2016-01-29 DIAGNOSIS — G40909 Epilepsy, unspecified, not intractable, without status epilepticus: Secondary | ICD-10-CM

## 2016-01-29 DIAGNOSIS — E039 Hypothyroidism, unspecified: Secondary | ICD-10-CM

## 2016-01-29 DIAGNOSIS — K5900 Constipation, unspecified: Secondary | ICD-10-CM

## 2016-01-29 DIAGNOSIS — S72002S Fracture of unspecified part of neck of left femur, sequela: Secondary | ICD-10-CM | POA: Diagnosis not present

## 2016-01-29 DIAGNOSIS — N400 Enlarged prostate without lower urinary tract symptoms: Secondary | ICD-10-CM

## 2016-01-29 DIAGNOSIS — F333 Major depressive disorder, recurrent, severe with psychotic symptoms: Secondary | ICD-10-CM

## 2016-01-29 NOTE — Progress Notes (Signed)
Patient ID: Randall Hoffman, male   DOB: [birth year], 81 y.o.   MRN: [id number]    DATE:  01/29/16  MRN:  [id number]  BIRTHDAY: [birth year]  Facility:  Nursing Home Location:  [facility]  Nursing Home Room Number: [id number]  LEVEL OF CARE:  SNF (31)  Contact Information    Name Relation Home Work Mobile   Bly Daughter [phone]     Juanantonio, Stolar [phone]         Code Status History    Date Active Date Inactive Code Status Order ID Comments User Context   12/20/2015  9:29 PM 12/22/2015  5:33 PM DNR [id number]  Hillary Bow, DO ED   10/11/2015  9:22 AM 10/16/2015  8:53 PM DNR [id number]  Yolanda Manges, DO ED    Questions for Most Recent Historical Code Status (Order [id number])    Question Answer Comment   In the event of cardiac or respiratory ARREST Do not call a "code blue"    In the event of cardiac or respiratory ARREST Do not perform Intubation, CPR, defibrillation or ACLS    In the event of cardiac or respiratory ARREST Use medication by any route, position, wound care, and other measures to relive pain and suffering. May use oxygen, suction and manual treatment of airway obstruction as needed for comfort.         Advance Directive Documentation   Flowsheet Row Most Recent Value  Type of Advance Directive  Out of facility DNR (pink MOST or yellow form)  Pre-existing out of facility DNR order (yellow form or pink MOST form)  Yellow form placed in chart (order not valid for inpatient use)  "MOST" Form in Place?  No data       Chief Complaint  Patient presents with  . Medical Management of Chronic Issues    HISTORY OF PRESENT ILLNESS:  This is a 81 year old male who is being seen for a routine visit. He is a long-term care and hospice/comfort care. He is seen in his room today and complained that his back hurts.   PAST MEDICAL HISTORY:  Past Medical History:  Diagnosis Date  . Acute blood loss anemia   . BPH (benign prostatic  hyperplasia)   . Chronic depression   . Constipation   . ESBL (extended spectrum beta-lactamase) producing bacteria infection   . Femur fracture, left (HCC)   . GERD (gastroesophageal reflux disease)   . Hepatitis C   . History of CVA (cerebrovascular accident)   . HLD (hyperlipidemia)   . Hyponatremia   . Lack of coordination   . Muscle weakness (generalized)   . Osteoporosis   . Protein calorie malnutrition (HCC)   . Seizure disorder (HCC)   . Stroke (HCC)   . Unsteadiness on feet      CURRENT MEDICATIONS: Reviewed  Patient's Medications  New Prescriptions   No medications on file  Previous Medications   ACETAMINOPHEN (TYLENOL) 500 MG TABLET    Take 1,000 mg by mouth 2 (two) times daily.   BACLOFEN (LIORESAL) 10 MG TABLET    Take 15 mg by mouth 3 (three) times daily. 1-1/2 tablets to = 15 mg   CITALOPRAM (CELEXA) 10 MG TABLET    Take 10 mg by mouth daily.    HYDROCERIN (EUCERIN) CREA    Apply 1 application topically daily. To bilateral legs and feet   METHOCARBAMOL (ROBAXIN) 500 MG TABLET    Take 500 mg by mouth every 8 (eight) hours  as needed for muscle spasms.   ONDANSETRON (ZOFRAN) 4 MG TABLET    Take 1 tablet (4 mg total) by mouth 2 (two) times daily. And may use 4 mg every 12 hours when necessary for nausea or vomiting.   OXYCODONE (OXY-IR) 5 MG CAPSULE    Take 10 mg by mouth every 8 (eight) hours.   OXYCODONE (OXY-IR) 5 MG CAPSULE    Take 5-10 mg by mouth every 4 (four) hours as needed for pain.   PANTOPRAZOLE (PROTONIX) 40 MG TABLET    Take 40 mg by mouth daily.   QUETIAPINE (SEROQUEL) 25 MG TABLET    Take 25 mg by mouth daily. In the morning   SENNOSIDES-DOCUSATE SODIUM (SENOKOT-S) 8.6-50 MG TABLET    Take 2 tablets by mouth 2 (two) times daily.   TAMSULOSIN (FLOMAX) 0.4 MG CAPS CAPSULE    Take 0.4 mg by mouth daily.    TOPIRAMATE (TOPAMAX) 50 MG TABLET    Take 50 mg by mouth 2 (two) times daily.    UNABLE TO FIND    Med Name: Med pass 120 mL by mouth 3 times daily   Modified Medications   No medications on file  Discontinued Medications   ALENDRONATE (FOSAMAX) 70 MG TABLET    Take 1 tablet (70 mg total) by mouth once a week. Take with a full glass of water on an empty stomach.   DOCUSATE SODIUM (COLACE) 100 MG CAPSULE    Take 100 mg by mouth daily.   METHOCARBAMOL (ROBAXIN) 500 MG TABLET    Take 1 tablet (500 mg total) by mouth every 6 (six) hours as needed for muscle spasms.   OXYCODONE (OXY IR/ROXICODONE) 5 MG IMMEDIATE RELEASE TABLET    Take 1-2 tablets (5-10 mg total) by mouth every 6 (six) hours as needed for moderate pain or severe pain.   SENNA (SENOKOT) 8.6 MG TABS TABLET    Take 1 tablet (8.6 mg total) by mouth 2 (two) times daily as needed for mild constipation.     Allergies  Allergen Reactions  . Penicillins Rash    Note patient had ancef on 10/11/15 with no reported complications     REVIEW OF SYSTEMS:  GENERAL: no change in appetite, no fatigue, no weight changes, no fever, chills or weakness EYES: Denies change in vision, dry eyes, eye pain, itching or discharge EARS: Denies change in hearing, ringing in ears, or earache NOSE: Denies nasal congestion or epistaxis MOUTH and THROAT: Denies oral discomfort, gingival pain or bleeding, pain from teeth or hoarseness   RESPIRATORY: no cough, SOB, DOE, wheezing, hemoptysis CARDIAC: no chest pain, edema or palpitations GI: no abdominal pain, diarrhea, constipation, heart burn, nausea or vomiting GU: Denies dysuria, frequency, hematuria, incontinence, or discharge PSYCHIATRIC: Denies feeling of depression or anxiety. No report of hallucinations, insomnia, paranoia   PHYSICAL EXAMINATION  GENERAL APPEARANCE: Well nourished. In no acute distress.  SKIN:  Left lateral lower leg has wound covered with collagenase and dry dressing  HEAD: Normal in size and contour. No evidence of trauma EYES: Lids open and close normally. No blepharitis, entropion or ectropion. PERRL. Conjunctivae are clear  and sclerae are white. Lenses are without opacity EARS: Pinnae are normal. Patient hears normal voice tunes of the examiner MOUTH and THROAT: Lips are without lesions. Oral mucosa is moist and without lesions. Tongue is normal in shape, size, and color and without lesions NECK: supple, trachea midline, no neck masses, no thyroid tenderness, no thyromegaly LYMPHATICS: no LAN in  the neck, no supraclavicular LAN RESPIRATORY: breathing is even & unlabored, BS CTAB CARDIAC: RRR, no murmur,no extra heart sounds, no edema GI: abdomen soft, normal BS, no masses, no tenderness, no hepatomegaly, no splenomegaly EXTREMITIES:  Able to move X 4 extremities PSYCHIATRIC: Alert and oriented to person.     LABS/RADIOLOGY: Labs reviewed: Basic Metabolic Panel:  Recent Labs  16/10/96 0304  11/20/15 0219  11/20/15 0559  12/20/15 12/20/15 1623 12/21/15 0329  NA 130*  < > 136  < > 138  < > 144 138 138  K 3.8  < > 2.2*  < > 3.0*  < > 3.0* 2.5* 3.0*  CL 101  --  106  < > 104  --   --  111 110  CO2 22  --  24  --   --   --   --  20* 21*  GLUCOSE 90  --  101*  < > 93  --   --  139* 100*  BUN 11  < > 12  < > 8  < > 29* 28* 22*  CREATININE 0.90  < > 0.56*  < > 0.50*  < > 0.6 0.94 0.73  CALCIUM 8.1*  --  7.7*  --   --   --   --  8.4* 8.1*  MG 1.9  --   --   --   --   --   --   --  2.0  < > = values in this interval not displayed. Liver Function Tests:  Recent Labs  10/11/15 0950 11/19/15 12/20/15 1623  AST 53* 36 41  ALT 42 17 25  ALKPHOS 76 75 61  BILITOT 1.3*  --  0.5  PROT 6.7  --  6.3*  ALBUMIN 3.1*  --  2.7*   CBC:  Recent Labs  11/20/15 0219  12/19/15 12/20/15 1623 12/21/15 0329  WBC 4.3  --  6.3 11.0* 8.1  NEUTROABS 1.8  --  3 8.6*  --   HGB 10.8*  < > 9.3* 7.5* 8.4*  HCT 31.1*  < > 30* 22.8* 25.5*  MCV 94.8  --   --  97.4 95.5  PLT 193  --  286 243 215  < > = values in this interval not displayed.  Lipid Panel:  Recent Labs  11/19/15  HDL 30*   Cardiac  Enzymes:  Recent Labs  12/20/15 1623  TROPONINI 0.04*    CBG:  Recent Labs  12/20/15 1752  GLUCAP 126*       ASSESSMENT/PLAN:   Normocytic anemia - S/P transfusion of 1 unit packed RBC; check CBC Lab Results  Component Value Date   HGB 8.4 (L) 12/21/2015   Left displaced femoral neck fracture S/P total hip arthroplasty  (10/11/15) - he is comfort care/hospice continue baclofen 10 mg 1 1/2 tab = 15 mg by mouth 3 times a day and Robaxin 500 mg 1 tab by mouth every 8 hours when necessary for muscle spasm; Roxicodone 10 mg PO Q 8 hours and 5 mg 1-2 tabs by mouth every 4 hours when necessary and acetaminophen 500 mg take 2 tabs by mouth twice a day for pain  Seizure disorder - no recent seizures; continue topiramate 50 mg 1 tab by mouth twice a day  GI bleed - + FOBT; no further evaluation or management; followed up by hospice; continue Protonix 40 mg 1 tab by mouth daily  Major Depression - continue citalopram 10 mg 1 tab by mouth daily  and Seroquel 25 mg 1 tab by mouth daily at bedtime  Constipation - continue Colace 100 mg 1 capsule by mouth daily and senna 8.6 mg 1 tab by mouth BID PRN  BPH - continue Flomax 0.4 mg 1 capsule by mouth daily   Hypothyroidism -  Patient and family does not want further testing nor treatment Lab Results  Component Value Date   TSH 18.48 (A) 01/14/2016   Protein calorie malnutrition, severe -  continue med Pass 120 mL 3 times a day  Nausea - continue Zofran 4 mg 1 tab PO BID and start Gingerale on trays TID     Goals of care:  Long-term care/Hospice care    Kenard Gower, NP [facility] [phone]

## 2016-02-29 ENCOUNTER — Encounter: Payer: Self-pay | Admitting: Adult Health

## 2016-02-29 ENCOUNTER — Non-Acute Institutional Stay (SKILLED_NURSING_FACILITY): Payer: Medicare Other | Admitting: Adult Health

## 2016-02-29 DIAGNOSIS — S72002A Fracture of unspecified part of neck of left femur, initial encounter for closed fracture: Secondary | ICD-10-CM

## 2016-02-29 DIAGNOSIS — F333 Major depressive disorder, recurrent, severe with psychotic symptoms: Secondary | ICD-10-CM

## 2016-02-29 DIAGNOSIS — D638 Anemia in other chronic diseases classified elsewhere: Secondary | ICD-10-CM | POA: Diagnosis not present

## 2016-02-29 DIAGNOSIS — D649 Anemia, unspecified: Secondary | ICD-10-CM | POA: Diagnosis not present

## 2016-02-29 DIAGNOSIS — N400 Enlarged prostate without lower urinary tract symptoms: Secondary | ICD-10-CM

## 2016-02-29 DIAGNOSIS — E43 Unspecified severe protein-calorie malnutrition: Secondary | ICD-10-CM

## 2016-02-29 DIAGNOSIS — R112 Nausea with vomiting, unspecified: Secondary | ICD-10-CM | POA: Diagnosis not present

## 2016-02-29 DIAGNOSIS — G40909 Epilepsy, unspecified, not intractable, without status epilepticus: Secondary | ICD-10-CM | POA: Diagnosis not present

## 2016-02-29 DIAGNOSIS — K274 Chronic or unspecified peptic ulcer, site unspecified, with hemorrhage: Secondary | ICD-10-CM | POA: Diagnosis not present

## 2016-02-29 DIAGNOSIS — K5900 Constipation, unspecified: Secondary | ICD-10-CM

## 2016-02-29 NOTE — Progress Notes (Signed)
Patient ID: Randall Hoffman, male   DOB: January 20, 1935, 81 y.o.   MRN: [id number]    DATE:    02/29/16  MRN:  [id number]  BIRTHDAY: 18-Jan-1935  Facility:  Nursing Home Location:  [facility]  Nursing Home Room Number: 407-P  LEVEL OF CARE:  SNF (31)  Contact Information    Name Relation Home Work Mobile   Turner Daughter [phone]     Hannah, Crill [phone]         Code Status History    Date Active Date Inactive Code Status Order ID Comments User Context   12/20/2015  9:29 PM 12/22/2015  5:33 PM DNR [id number]  Hillary Bow, DO ED   10/11/2015  9:22 AM 10/16/2015  8:53 PM DNR [id number]  Yolanda Manges, DO ED    Questions for Most Recent Historical Code Status (Order [id number])    Question Answer Comment   In the event of cardiac or respiratory ARREST Do not call a "code blue"    In the event of cardiac or respiratory ARREST Do not perform Intubation, CPR, defibrillation or ACLS    In the event of cardiac or respiratory ARREST Use medication by any route, position, wound care, and other measures to relive pain and suffering. May use oxygen, suction and manual treatment of airway obstruction as needed for comfort.         Advance Directive Documentation   Flowsheet Row Most Recent Value  Type of Advance Directive  Out of facility DNR (pink MOST or yellow form)  Pre-existing out of facility DNR order (yellow form or pink MOST form)  Yellow form placed in chart (order not valid for inpatient use)  "MOST" Form in Place?  No data       Chief Complaint  Patient presents with  . Medical Management of Chronic Issues    HISTORY OF PRESENT ILLNESS:  This is a 81 year old male who is being seen for a routine visit. He is a long-term care and hospice/comfort care. He was seen in his room today and he was not in good. I told him that I need to see him to make sure he is in good condition. He said,"Hurry up and don't come back!" Seroquel was recently  increased. He has been refusing his medications and even care. Gel overlay was recently discontinued per patient's request.   PAST MEDICAL HISTORY:  Past Medical History:  Diagnosis Date  . Acute blood loss anemia   . BPH (benign prostatic hyperplasia)   . Chronic depression   . Constipation   . ESBL (extended spectrum beta-lactamase) producing bacteria infection   . Femur fracture, left (HCC)   . GERD (gastroesophageal reflux disease)   . Hepatitis C   . History of CVA (cerebrovascular accident)   . HLD (hyperlipidemia)   . Hyponatremia   . Lack of coordination   . Muscle weakness (generalized)   . Osteoporosis   . Protein calorie malnutrition (HCC)   . Seizure disorder (HCC)   . Stroke (HCC)   . Unsteadiness on feet      CURRENT MEDICATIONS: Reviewed  Patient's Medications  New Prescriptions   No medications on file  Previous Medications   BACLOFEN (LIORESAL) 10 MG TABLET    Take 15 mg by mouth 3 (three) times daily. 1-1/2 tablets to = 15 mg   CITALOPRAM (CELEXA) 10 MG TABLET    Take 10 mg by mouth daily.    HALOPERIDOL (HALDOL) 5 MG TABLET  Take 5 mg by mouth 2 (two) times daily. May give in MedPass   HALOPERIDOL (HALDOL) 5 MG TABLET    Take 5 mg by mouth every 4 (four) hours as needed for agitation.   HYDROCERIN (EUCERIN) CREA    Apply 1 application topically daily. To bilateral legs and feet   METHOCARBAMOL (ROBAXIN) 500 MG TABLET    Take 500 mg by mouth every 8 (eight) hours as needed for muscle spasms.   ONDANSETRON (ZOFRAN) 4 MG TABLET    Take 1 tablet (4 mg total) by mouth 2 (two) times daily. And may use 4 mg every 12 hours when necessary for nausea or vomiting.   OXYCODONE (OXY-IR) 5 MG CAPSULE    Take 10 mg by mouth every 8 (eight) hours.    OXYCODONE (OXY-IR) 5 MG CAPSULE    Take 5-10 mg by mouth every 4 (four) hours as needed for pain.   PANTOPRAZOLE (PROTONIX) 40 MG TABLET    Take 40 mg by mouth daily.   SENNOSIDES-DOCUSATE SODIUM (SENOKOT-S) 8.6-50 MG  TABLET    Take 2 tablets by mouth 2 (two) times daily.   TOPIRAMATE (TOPAMAX) 50 MG TABLET    Take 50 mg by mouth 2 (two) times daily.    UNABLE TO FIND    Med Name: Med pass 120 mL by mouth 3 times daily  Modified Medications   No medications on file  Discontinued Medications   ACETAMINOPHEN (TYLENOL) 500 MG TABLET    Take 1,000 mg by mouth 2 (two) times daily.    QUETIAPINE (SEROQUEL) 25 MG TABLET    Take 25 mg by mouth daily. In the morning   QUETIAPINE (SEROQUEL) 50 MG TABLET    Take 50 mg by mouth at bedtime.   TAMSULOSIN (FLOMAX) 0.4 MG CAPS CAPSULE    Take 0.4 mg by mouth daily.      Allergies  Allergen Reactions  . Penicillins Rash    Note patient had ancef on 10/11/15 with no reported complications     REVIEW OF SYSTEMS:  GENERAL: no change in appetite, no fatigue, no weight changes, no fever, chills or weakness EYES: Denies change in vision, dry eyes, eye pain, itching or discharge EARS: Denies change in hearing, ringing in ears, or earache NOSE: Denies nasal congestion or epistaxis MOUTH and THROAT: Denies oral discomfort, gingival pain or bleeding, pain from teeth or hoarseness   RESPIRATORY: no cough, SOB, DOE, wheezing, hemoptysis CARDIAC: no chest pain, edema or palpitations GI: no abdominal pain, diarrhea, constipation, heart burn, nausea or vomiting GU: Denies dysuria, frequency, hematuria, incontinence, or discharge PSYCHIATRIC: Denies feeling of depression or anxiety. No report of hallucinations, insomnia, paranoia   PHYSICAL EXAMINATION  GENERAL APPEARANCE:  In no acute distress.  SKIN:  Skin is warm and dry HEAD: Normal in size and contour. No evidence of trauma EYES: Lids open and close normally. No blepharitis, entropion or ectropion. PERRL. Conjunctivae are clear and sclerae are white. Lenses are without opacity EARS: Pinnae are normal. Patient hears normal voice tunes of the examiner MOUTH and THROAT: Lips are without lesions. Oral mucosa is moist and  without lesions. Tongue is normal in shape, size, and color and without lesions NECK: supple, trachea midline, no neck masses, no thyroid tenderness, no thyromegaly LYMPHATICS: no LAN in the neck, no supraclavicular LAN RESPIRATORY: breathing is even & unlabored, BS CTAB CARDIAC: RRR, no murmur,no extra heart sounds, no edema GI: abdomen soft, normal BS, no masses, no tenderness, no hepatomegaly, no splenomegaly  EXTREMITIES:  Able to move X 4 extremities PSYCHIATRIC: Alert and oriented to person. Agitated    LABS/RADIOLOGY: Labs reviewed: Basic Metabolic Panel:  Recent Labs  16/10/96 0304  11/20/15 0219  11/20/15 0559  12/20/15 12/20/15 1623 12/21/15 0329  NA 130*  < > 136  < > 138  < > 144 138 138  K 3.8  < > 2.2*  < > 3.0*  < > 3.0* 2.5* 3.0*  CL 101  --  106  < > 104  --   --  111 110  CO2 22  --  24  --   --   --   --  20* 21*  GLUCOSE 90  --  101*  < > 93  --   --  139* 100*  BUN 11  < > 12  < > 8  < > 29* 28* 22*  CREATININE 0.90  < > 0.56*  < > 0.50*  < > 0.6 0.94 0.73  CALCIUM 8.1*  --  7.7*  --   --   --   --  8.4* 8.1*  MG 1.9  --   --   --   --   --   --   --  2.0  < > = values in this interval not displayed. Liver Function Tests:  Recent Labs  10/11/15 0950 11/19/15 12/20/15 1623  AST 53* 36 41  ALT 42 17 25  ALKPHOS 76 75 61  BILITOT 1.3*  --  0.5  PROT 6.7  --  6.3*  ALBUMIN 3.1*  --  2.7*   CBC:  Recent Labs  11/20/15 0219  12/19/15 12/20/15 1623 12/21/15 0329  WBC 4.3  --  6.3 11.0* 8.1  NEUTROABS 1.8  --  3 8.6*  --   HGB 10.8*  < > 9.3* 7.5* 8.4*  HCT 31.1*  < > 30* 22.8* 25.5*  MCV 94.8  --   --  97.4 95.5  PLT 193  --  286 243 215  < > = values in this interval not displayed.  Lipid Panel:  Recent Labs  11/19/15  HDL 30*   Cardiac Enzymes:  Recent Labs  12/20/15 1623  TROPONINI 0.04*    CBG:  Recent Labs  12/20/15 1752  GLUCAP 126*       ASSESSMENT/PLAN:  Normocytic anemia - stable; no lab work ; pt is comfort  care Lab Results  Component Value Date   HGB 8.4 (L) 12/21/2015   Left displaced femoral neck fracture S/P total hip arthroplasty  (10/11/15) - he is comfort care/hospice continue baclofen 10 mg 1 1/2 tab = 15 mg by mouth  Q 8 hours and Robaxin 500 mg 1 tab by mouth every 8 hours when necessary for muscle spasm; Roxicodone 10 mg PO Q 8 hours and 5 mg 1-2 tabs by mouth every 4 hours when necessary and acetaminophen 500 mg take 2 tabs by mouth twice a day for pain  Seizure disorder - no recent seizures; continue topiramate 50 mg 1 tab by mouth twice a day  GI bleed - + FOBT; no further evaluation or management; followed up by hospice; continue Protonix 40 mg 1 tab by mouth daily  Major Depression with Psychosis - continue citalopram 10 mg 1 tab by mouth daily and Seroquel 50mg  1 tab by mouth BID  Constipation - continue Senna-S 8.6-50 mg 2 tabs PO BID  BPH - continue Flomax 0.4 mg 1 capsule by mouth daily   Hypothyroidism -  Patient and family does not want further testing nor treatment Lab Results  Component Value Date   TSH 18.48 (A) 01/14/2016   Protein calorie malnutrition, severe -  continue med Pass 120 mL 3 times a day  Nausea - continue Zofran 4 mg 1 tab PO BID and Gingerale on trays TID     Goals of care:  Long-term care/Hospice care    Kenard GowerMonina Medina-Vargas, NP [facility] [phone]

## 2016-03-20 ENCOUNTER — Encounter: Payer: Self-pay | Admitting: Adult Health

## 2016-03-20 ENCOUNTER — Non-Acute Institutional Stay (SKILLED_NURSING_FACILITY): Payer: Medicare Other | Admitting: Adult Health

## 2016-03-20 DIAGNOSIS — G894 Chronic pain syndrome: Secondary | ICD-10-CM | POA: Diagnosis not present

## 2016-03-20 NOTE — Progress Notes (Signed)
Patient ID: Randall Hoffman, male   DOB: 07-25-1934, 81 y.o.   MRN: [id number]    DATE:    03/20/16  MRN:  [id number]  BIRTHDAY: 07-25-1934  Facility:  Nursing Home Location:  [facility]  Nursing Home Room Number: [id number]  LEVEL OF CARE:  SNF (31)  Contact Information    Name Relation Home Work Mobile   PalomaRomano,Donna Daughter [phone]     Sheppard Coilngarola,Mary Spouse [phone]         Code Status History    Date Active Date Inactive Code Status Order ID Comments User Context   12/20/2015  9:29 PM 12/22/2015  5:33 PM DNR [id number]  Hillary BowJared M Gardner, DO ED   10/11/2015  9:22 AM 10/16/2015  8:53 PM DNR [id number]  Yolanda MangesAlex M Wilson, DO ED    Questions for Most Recent Historical Code Status (Order [id number])    Question Answer Comment   In the event of cardiac or respiratory ARREST Do not call a "code blue"    In the event of cardiac or respiratory ARREST Do not perform Intubation, CPR, defibrillation or ACLS    In the event of cardiac or respiratory ARREST Use medication by any route, position, wound care, and other measures to relive pain and suffering. May use oxygen, suction and manual treatment of airway obstruction as needed for comfort.         Advance Directive Documentation   Flowsheet Row Most Recent Value  Type of Advance Directive  Out of facility DNR (pink MOST or yellow form)  Pre-existing out of facility DNR order (yellow form or pink MOST form)  Yellow form placed in chart (order not valid for inpatient use)  "MOST" Form in Place?  No data       Chief Complaint  Patient presents with  . Acute Visit    Pain management    HISTORY OF PRESENT ILLNESS:  This is a 81 year old male who is requesting that his pain medication be increased. He was seen in his room today. He said that his pain is all over his body and there is nothing specific. He is comfort care/hospice.   PAST MEDICAL HISTORY:  Past Medical History:  Diagnosis Date  . Acute blood loss  anemia   . BPH (benign prostatic hyperplasia)   . Chronic depression   . Constipation   . ESBL (extended spectrum beta-lactamase) producing bacteria infection   . Femur fracture, left (HCC)   . GERD (gastroesophageal reflux disease)   . Hepatitis C   . History of CVA (cerebrovascular accident)   . HLD (hyperlipidemia)   . Hyponatremia   . Lack of coordination   . Muscle weakness (generalized)   . Osteoporosis   . Protein calorie malnutrition (HCC)   . Seizure disorder (HCC)   . Stroke (HCC)   . Unsteadiness on feet      CURRENT MEDICATIONS: Reviewed  Patient's Medications  New Prescriptions   No medications on file  Previous Medications   BACLOFEN (LIORESAL) 10 MG TABLET    Take 15 mg by mouth 3 (three) times daily. 1-1/2 tablets to = 15 mg   CITALOPRAM (CELEXA) 10 MG TABLET    Take 10 mg by mouth daily.    HALOPERIDOL (HALDOL) 5 MG TABLET    Take 5 mg by mouth 2 (two) times daily. May give in MedPass   HALOPERIDOL (HALDOL) 5 MG TABLET    Take 5 mg by mouth every 4 (four) hours as needed for agitation.  HYDROCERIN (EUCERIN) CREA    Apply 1 application topically daily. To bilateral legs and feet   METHOCARBAMOL (ROBAXIN) 500 MG TABLET    Take 500 mg by mouth every 8 (eight) hours as needed for muscle spasms.   ONDANSETRON (ZOFRAN) 4 MG TABLET    Take 1 tablet (4 mg total) by mouth 2 (two) times daily. And may use 4 mg every 12 hours when necessary for nausea or vomiting.   OXYCODONE (OXY-IR) 5 MG CAPSULE    Take 10 mg by mouth every 8 (eight) hours.    OXYCODONE (OXY-IR) 5 MG CAPSULE    Take 5-10 mg by mouth every 4 (four) hours as needed for pain.   PANTOPRAZOLE (PROTONIX) 40 MG TABLET    Take 40 mg by mouth daily.   SENNOSIDES-DOCUSATE SODIUM (SENOKOT-S) 8.6-50 MG TABLET    Take 2 tablets by mouth 2 (two) times daily.   TOPIRAMATE (TOPAMAX) 50 MG TABLET    Take 50 mg by mouth 2 (two) times daily.    UNABLE TO FIND    Med Name: Med pass 120 mL by mouth 3 times daily  Modified  Medications   No medications on file  Discontinued Medications   ACETAMINOPHEN (TYLENOL) 500 MG TABLET    Take 1,000 mg by mouth 2 (two) times daily.    QUETIAPINE (SEROQUEL) 25 MG TABLET    Take 25 mg by mouth daily. In the morning   QUETIAPINE (SEROQUEL) 50 MG TABLET    Take 50 mg by mouth at bedtime.   TAMSULOSIN (FLOMAX) 0.4 MG CAPS CAPSULE    Take 0.4 mg by mouth daily.      Allergies  Allergen Reactions  . Penicillins Rash    Note patient had ancef on 10/11/15 with no reported complications     REVIEW OF SYSTEMS:  GENERAL: no change in appetite, no fatigue, no weight changes, no fever, chills or weakness EYES: Denies change in vision, dry eyes, eye pain, itching or discharge EARS: Denies change in hearing, ringing in ears, or earache NOSE: Denies nasal congestion or epistaxis MOUTH and THROAT: Denies oral discomfort, gingival pain or bleeding, pain from teeth or hoarseness   RESPIRATORY: no cough, SOB, DOE, wheezing, hemoptysis CARDIAC: no chest pain, edema or palpitations GI: no abdominal pain, diarrhea, constipation, heart burn, nausea or vomiting GU: Denies dysuria, frequency, hematuria, incontinence, or discharge PSYCHIATRIC: Denies feeling of depression or anxiety. No report of hallucinations, insomnia, paranoia   PHYSICAL EXAMINATION  GENERAL APPEARANCE:  In no acute distress.  SKIN:  Skin is warm and dry HEAD: Normal in size and contour. No evidence of trauma EYES: Lids open and close normally. No blepharitis, entropion or ectropion. PERRL. Conjunctivae are clear and sclerae are white. Lenses are without opacity EARS: Pinnae are normal. Patient hears normal voice tunes of the examiner MOUTH and THROAT: Lips are without lesions. Oral mucosa is moist and without lesions. Tongue is normal in shape, size, and color and without lesions NECK: supple, trachea midline, no neck masses, no thyroid tenderness, no thyromegaly LYMPHATICS: no LAN in the neck, no supraclavicular  LAN RESPIRATORY: breathing is even & unlabored, BS CTAB CARDIAC: RRR, no murmur,no extra heart sounds, no edema GI: abdomen soft, normal BS, no masses, no tenderness, no hepatomegaly, no splenomegaly EXTREMITIES:  Able to move X 4 extremities; generalized weakness BLE PSYCHIATRIC: Alert and oriented to person. Agitated   LABS/RADIOLOGY: Labs reviewed: Basic Metabolic Panel:  Recent Labs  16/10/96 0304  11/20/15 0219  11/20/15 0559  12/20/15 12/20/15 1623 12/21/15 0329  NA 130*  < > 136  < > 138  < > 144 138 138  K 3.8  < > 2.2*  < > 3.0*  < > 3.0* 2.5* 3.0*  CL 101  --  106  < > 104  --   --  111 110  CO2 22  --  24  --   --   --   --  20* 21*  GLUCOSE 90  --  101*  < > 93  --   --  139* 100*  BUN 11  < > 12  < > 8  < > 29* 28* 22*  CREATININE 0.90  < > 0.56*  < > 0.50*  < > 0.6 0.94 0.73  CALCIUM 8.1*  --  7.7*  --   --   --   --  8.4* 8.1*  MG 1.9  --   --   --   --   --   --   --  2.0  < > = values in this interval not displayed. Liver Function Tests:  Recent Labs  10/11/15 0950 11/19/15 12/20/15 1623  AST 53* 36 41  ALT 42 17 25  ALKPHOS 76 75 61  BILITOT 1.3*  --  0.5  PROT 6.7  --  6.3*  ALBUMIN 3.1*  --  2.7*   CBC:  Recent Labs  11/20/15 0219  12/19/15 12/20/15 1623 12/21/15 0329  WBC 4.3  --  6.3 11.0* 8.1  NEUTROABS 1.8  --  3 8.6*  --   HGB 10.8*  < > 9.3* 7.5* 8.4*  HCT 31.1*  < > 30* 22.8* 25.5*  MCV 94.8  --   --  97.4 95.5  PLT 193  --  286 243 215  < > = values in this interval not displayed.  Lipid Panel:  Recent Labs  11/19/15  HDL 30*   Cardiac Enzymes:  Recent Labs  12/20/15 1623  TROPONINI 0.04*    CBG:  Recent Labs  12/20/15 1752  GLUCAP 126*       ASSESSMENT/PLAN:  Chronic pain - discontinue Oxycodone 5 mg 2 tabs PO TID; start Oxycontin ER 10 mg 12 hr 1 tab PO Q 12 hours and continue Oxycodone 5 mg 1-2 tabs PO Q 4 hours PRN    Kenard Gower, NP [employer] [phone]

## 2016-03-27 ENCOUNTER — Encounter: Payer: Self-pay | Admitting: Adult Health

## 2016-03-27 ENCOUNTER — Non-Acute Institutional Stay (SKILLED_NURSING_FACILITY): Payer: Medicare Other | Admitting: Adult Health

## 2016-03-27 DIAGNOSIS — G40909 Epilepsy, unspecified, not intractable, without status epilepticus: Secondary | ICD-10-CM

## 2016-03-27 DIAGNOSIS — S72002A Fracture of unspecified part of neck of left femur, initial encounter for closed fracture: Secondary | ICD-10-CM | POA: Diagnosis not present

## 2016-03-27 DIAGNOSIS — N400 Enlarged prostate without lower urinary tract symptoms: Secondary | ICD-10-CM | POA: Diagnosis not present

## 2016-03-27 DIAGNOSIS — K5900 Constipation, unspecified: Secondary | ICD-10-CM

## 2016-03-27 DIAGNOSIS — E43 Unspecified severe protein-calorie malnutrition: Secondary | ICD-10-CM | POA: Diagnosis not present

## 2016-03-27 DIAGNOSIS — K274 Chronic or unspecified peptic ulcer, site unspecified, with hemorrhage: Secondary | ICD-10-CM | POA: Diagnosis not present

## 2016-03-27 DIAGNOSIS — F333 Major depressive disorder, recurrent, severe with psychotic symptoms: Secondary | ICD-10-CM | POA: Diagnosis not present

## 2016-03-27 DIAGNOSIS — D638 Anemia in other chronic diseases classified elsewhere: Secondary | ICD-10-CM | POA: Diagnosis not present

## 2016-03-27 DIAGNOSIS — R112 Nausea with vomiting, unspecified: Secondary | ICD-10-CM

## 2016-03-27 NOTE — Progress Notes (Deleted)
Patient ID: Randall IrishDonald Hoffman, male   DOB: April 03, 1935, 81 y.o.   MRN: [id number]

## 2016-04-03 ENCOUNTER — Other Ambulatory Visit: Payer: Self-pay | Admitting: *Deleted

## 2016-04-03 MED ORDER — OXYCODONE HCL ER 10 MG PO T12A: EXTENDED_RELEASE_TABLET | ORAL | 0 refills | Status: AC

## 2016-04-03 NOTE — Telephone Encounter (Signed)
[facility] #[phone] Fax: [phone] 

## 2016-04-10 NOTE — Progress Notes (Signed)
Patient ID: Randall Hoffman, male   DOB: 1935/01/17, 81 y.o.   MRN: [id number]    DATE:    03/27/16  MRN:  [id number]  BIRTHDAY: 07-07-1934  Facility:  Nursing Home Location:  [facility]     LEVEL OF CARE:  SNF (31)  Contact Information    Name Relation Home Work Mobile   [place] Daughter [phone]     Hatim, Homann [phone]         Code Status History    Date Active Date Inactive Code Status Order ID Comments User Context   12/20/2015  9:29 PM 12/22/2015  5:33 PM DNR [id number]  Hillary Bow, DO ED   10/11/2015  9:22 AM 10/16/2015  8:53 PM DNR [id number]  Yolanda Manges, DO ED    Questions for Most Recent Historical Code Status (Order [id number])    Question Answer Comment   In the event of cardiac or respiratory ARREST Do not call a "code blue"    In the event of cardiac or respiratory ARREST Do not perform Intubation, CPR, defibrillation or ACLS    In the event of cardiac or respiratory ARREST Use medication by any route, position, wound care, and other measures to relive pain and suffering. May use oxygen, suction and manual treatment of airway obstruction as needed for comfort.         Advance Directive Documentation   Flowsheet Row Most Recent Value  Type of Advance Directive  Out of facility DNR (pink MOST or yellow form)  Pre-existing out of facility DNR order (yellow form or pink MOST form)  Yellow form placed in chart (order not valid for inpatient use)  "MOST" Form in Place?  No data       Chief Complaint  Patient presents with  . Medical Management of Chronic Issues    HISTORY OF PRESENT ILLNESS:  This is a 81 year old male who is being seen for a routine visit. He is a long-term care and hospice/comfort care. Haldol has recently been scheduled Q 2 weeks. Pain medication has been adjusted recently - now on Oxycodone 10 mg ER Q 12 hours.   PAST MEDICAL HISTORY:  Past Medical History:  Diagnosis Date  . Acute blood loss  anemia   . BPH (benign prostatic hyperplasia)   . Chronic depression   . Constipation   . ESBL (extended spectrum beta-lactamase) producing bacteria infection   . Femur fracture, left (HCC)   . GERD (gastroesophageal reflux disease)   . Hepatitis C   . History of CVA (cerebrovascular accident)   . HLD (hyperlipidemia)   . Hyponatremia   . Lack of coordination   . Muscle weakness (generalized)   . Osteoporosis   . Protein calorie malnutrition (HCC)   . Seizure disorder (HCC)   . Stroke (HCC)   . Unsteadiness on feet      CURRENT MEDICATIONS: Reviewed  Patient's Medications  New Prescriptions   OXYCODONE (OXYCONTIN) 10 MG 12 HR TABLET    Take one tablet by mouth every 12 hours for pain. Do not crush  Previous Medications   BACLOFEN (LIORESAL) 10 MG TABLET    Take 15 mg by mouth 3 (three) times daily. 1-1/2 tablets to = 15 mg   CITALOPRAM (CELEXA) 10 MG TABLET    Take 10 mg by mouth daily.    HALOPERIDOL (HALDOL) 5 MG TABLET    Take 5 mg by mouth every 4 (four) hours as needed for agitation.   HALOPERIDOL DECANOATE (  HALDOL DECANOATE) 50 MG/ML INJECTION    Inject 50 mg into the muscle every 14 (fourteen) days.   HYDROCERIN (EUCERIN) CREA    Apply 1 application topically daily. To bilateral legs and feet   METHOCARBAMOL (ROBAXIN) 500 MG TABLET    Take 500 mg by mouth every 8 (eight) hours as needed for muscle spasms.   ONDANSETRON (ZOFRAN) 4 MG TABLET    Take 1 tablet (4 mg total) by mouth 2 (two) times daily. And may use 4 mg every 12 hours when necessary for nausea or vomiting.   OXYCODONE (OXY-IR) 5 MG CAPSULE    Take 10 mg by mouth 2 (two) times daily.    OXYCODONE (OXY-IR) 5 MG CAPSULE    Take 5-10 mg by mouth every 4 (four) hours as needed for pain.   PANTOPRAZOLE (PROTONIX) 40 MG TABLET    Take 40 mg by mouth daily.   SENNOSIDES-DOCUSATE SODIUM (SENOKOT-S) 8.6-50 MG TABLET    Take 2 tablets by mouth 2 (two) times daily.   TOPIRAMATE (TOPAMAX) 50 MG TABLET    Take 50 mg by mouth  2 (two) times daily.    UNABLE TO FIND    Med Name: Med pass 120 mL by mouth 3 times daily  Modified Medications   No medications on file  Discontinued Medications   HALOPERIDOL (HALDOL) 5 MG TABLET    Take 5 mg by mouth 2 (two) times daily. May give in MedPass     Allergies  Allergen Reactions  . Penicillins Rash    Note patient had ancef on 10/11/15 with no reported complications     REVIEW OF SYSTEMS:  GENERAL:  no fever, chills  EYES: Denies change in vision, dry eyes, eye pain, itching or discharge EARS: Denies change in hearing, ringing in ears, or earache NOSE: Denies nasal congestion or epistaxis MOUTH and THROAT: Denies oral discomfort, gingival pain or bleeding, pain from teeth or hoarseness   RESPIRATORY: no cough, SOB, DOE, wheezing, hemoptysis CARDIAC: no chest pain, edema or palpitations GI: no abdominal pain, diarrhea, constipation, heart burn, nausea or vomiting GU: Denies dysuria, frequency, hematuria, incontinence, or discharge PSYCHIATRIC: No report of hallucinations, insomnia, paranoia   PHYSICAL EXAMINATION  GENERAL APPEARANCE:  In no acute distress.  SKIN:  Skin is warm and dry HEAD: Normal in size and contour. No evidence of trauma EYES: Lids open and close normally. No blepharitis, entropion or ectropion. PERRL. Conjunctivae are clear and sclerae are white. Lenses are without opacity EARS: Pinnae are normal. Patient hears normal voice tunes of the examiner MOUTH and THROAT: Lips are without lesions. Oral mucosa is moist and without lesions. Tongue is normal in shape, size, and color and without lesions NECK: supple, trachea midline, no neck masses, no thyroid tenderness, no thyromegaly LYMPHATICS: no LAN in the neck, no supraclavicular LAN RESPIRATORY: breathing is even & unlabored, BS CTAB CARDIAC: RRR, no murmur,no extra heart sounds, no edema GI: abdomen soft, normal BS, no masses, no tenderness, no hepatomegaly, no splenomegaly EXTREMITIES:  Able  to move X 4 extremities PSYCHIATRIC: Alert and oriented to person. Agitated with closed fists.    LABS/RADIOLOGY: Labs reviewed: Basic Metabolic Panel:  Recent Labs  16/02/9604/22/17 0304  11/20/15 0219  11/20/15 0559  12/20/15 12/20/15 1623 12/21/15 0329  NA 130*  < > 136  < > 138  < > 144 138 138  K 3.8  < > 2.2*  < > 3.0*  < > 3.0* 2.5* 3.0*  CL 101  --  106  < > 104  --   --  111 110  CO2 22  --  24  --   --   --   --  20* 21*  GLUCOSE 90  --  101*  < > 93  --   --  139* 100*  BUN 11  < > 12  < > 8  < > 29* 28* 22*  CREATININE 0.90  < > 0.56*  < > 0.50*  < > 0.6 0.94 0.73  CALCIUM 8.1*  --  7.7*  --   --   --   --  8.4* 8.1*  MG 1.9  --   --   --   --   --   --   --  2.0  < > = values in this interval not displayed. Liver Function Tests:  Recent Labs  10/11/15 0950 11/19/15 12/20/15 1623  AST 53* 36 41  ALT 42 17 25  ALKPHOS 76 75 61  BILITOT 1.3*  --  0.5  PROT 6.7  --  6.3*  ALBUMIN 3.1*  --  2.7*   CBC:  Recent Labs  11/20/15 0219  12/19/15 12/20/15 1623 12/21/15 0329  WBC 4.3  --  6.3 11.0* 8.1  NEUTROABS 1.8  --  3 8.6*  --   HGB 10.8*  < > 9.3* 7.5* 8.4*  HCT 31.1*  < > 30* 22.8* 25.5*  MCV 94.8  --   --  97.4 95.5  PLT 193  --  286 243 215  < > = values in this interval not displayed.  Lipid Panel:  Recent Labs  11/19/15  HDL 30*   Cardiac Enzymes:  Recent Labs  12/20/15 1623  TROPONINI 0.04*    CBG:  Recent Labs  12/20/15 1752  GLUCAP 126*       ASSESSMENT/PLAN:  Anemia of chronic disease - stable; no lab work ; pt is comfort care Lab Results  Component Value Date   HGB 8.4 (L) 12/21/2015   Left displaced femoral neck fracture S/P total hip arthroplasty  (10/11/15) - he is comfort care/hospice continue baclofen 10 mg 1 1/2 tab = 15 mg by mouth  Q 8 hours and Robaxin 500 mg 1 tab by mouth every 8 hours when necessary for muscle spasm; Oxycodone ER 10 mg 1 PO Q 12 hours and Oxycodone 5 mg 1-2 tabs by mouth every 4 hours when  necessary and acetaminophen 500 mg take 2 tabs by mouth twice a day for pain  Seizure disorder - no recent seizures; continue topiramate 50 mg 1 tab by mouth twice a day  GI bleed - + FOBT; no further evaluation or management; followed up by hospice; continue Protonix 40 mg 1 tab by mouth daily  Major Depression with Psychosis - continue citalopram 10 mg 1 tab by mouth daily and Haldol Decanoate 50 mg IM Q 2 weeks  Constipation - continue Senna-S 8.6-50 mg 2 tabs PO BID  BPH - continue Flomax 0.4 mg 1 capsule by mouth daily   Hypothyroidism -  Patient and family does not want further testing nor treatment Lab Results  Component Value Date   TSH 18.48 (A) 01/14/2016   Protein calorie malnutrition, severe -  continue med Pass 120 mL 3 times a day  Nausea - continue Zofran 4 mg 1 tab PO BID      Goals of care:  Long-term care/Hospice care    Kenard GowerMonina Medina-Vargas, NP [facility] [phone]

## 2016-04-15 ENCOUNTER — Other Ambulatory Visit: Payer: Self-pay

## 2016-04-15 MED ORDER — OXYCODONE HCL 5 MG PO CAPS: 5.0000 mg | ORAL_CAPSULE | ORAL | 0 refills | Status: AC | PRN

## 2016-04-15 NOTE — Telephone Encounter (Signed)
Rx faxed to [facility] @ [phone], phone number [phone]  

## 2016-04-30 ENCOUNTER — Encounter: Payer: Self-pay | Admitting: Internal Medicine

## 2016-04-30 ENCOUNTER — Non-Acute Institutional Stay (SKILLED_NURSING_FACILITY): Payer: Medicare Other | Admitting: Internal Medicine

## 2016-04-30 DIAGNOSIS — K5909 Other constipation: Secondary | ICD-10-CM

## 2016-04-30 DIAGNOSIS — F0390 Unspecified dementia without behavioral disturbance: Secondary | ICD-10-CM | POA: Diagnosis not present

## 2016-04-30 DIAGNOSIS — F319 Bipolar disorder, unspecified: Secondary | ICD-10-CM

## 2016-04-30 DIAGNOSIS — M7989 Other specified soft tissue disorders: Secondary | ICD-10-CM | POA: Diagnosis not present

## 2016-04-30 DIAGNOSIS — K219 Gastro-esophageal reflux disease without esophagitis: Secondary | ICD-10-CM

## 2016-04-30 DIAGNOSIS — E43 Unspecified severe protein-calorie malnutrition: Secondary | ICD-10-CM | POA: Diagnosis not present

## 2016-04-30 DIAGNOSIS — G40909 Epilepsy, unspecified, not intractable, without status epilepticus: Secondary | ICD-10-CM | POA: Diagnosis not present

## 2016-04-30 DIAGNOSIS — F03C0 Unspecified dementia, severe, without behavioral disturbance, psychotic disturbance, mood disturbance, and anxiety: Secondary | ICD-10-CM

## 2016-04-30 NOTE — Progress Notes (Signed)
LOCATION: [facility]  PCP: No PCP Per Patient   Code Status: DNR  Goals of care: Advanced Directive information Advanced Directives 03/27/2016  Does Patient Have a Medical Advance Directive? Yes  Type of Advance Directive Out of facility DNR (pink MOST or yellow form)  Does patient want to make changes to medical advance directive? No - Patient declined  Copy of Healthcare Power of Attorney in Chart? Yes  Pre-existing out of facility DNR order (yellow form or pink MOST form) -       Extended Emergency Contact Information Primary Emergency Contact: Romano,Donna Address: [address]          [place], [place] [address] [place] Home Phone: [phone] Relation: Daughter Secondary Emergency Contact: Bernestine Amass Address: [place] Home Phone: [phone] Relation: Spouse   Allergies  Allergen Reactions  . Penicillins Rash    Note patient had ancef on 10/11/15 with no reported complications    Chief Complaint  Patient presents with  . Medical Management of Chronic Issues    Routine Visit     HPI:  Patient is a 81 y.o. male seen today for routine visit. He is in his bed all day. He is under total care. He is under hospice services. He is seen in his room today. He appears comfortable. Staff have noticed swelling to his right hand for few days with complaint of pain with movement. He had xray that ruled out fracture. He has been taking his medications. He has stayed calm in recent days.   Review of Systems: minimal participation Constitutional: Negative for fever HENT: Negative for headache, difficulty swallowing.   Eyes: Negative for double vision and discharge.  Respiratory: Negative for cough, shortness of breath Cardiovascular: Negative for chest pain Gastrointestinal: Negative for heartburn, nausea and vomiting Genitourinary: Negative for dysuria Musculoskeletal: Negative for fall in the facility.  Skin:  Negative for itching, rash.     Past Medical History:  Diagnosis Date  . Acute blood loss anemia   . BPH (benign prostatic hyperplasia)   . Chronic depression   . Constipation   . ESBL (extended spectrum beta-lactamase) producing bacteria infection   . Femur fracture, left (HCC)   . GERD (gastroesophageal reflux disease)   . Hepatitis C   . History of CVA (cerebrovascular accident)   . HLD (hyperlipidemia)   . Hyponatremia   . Lack of coordination   . Muscle weakness (generalized)   . Osteoporosis   . Protein calorie malnutrition (HCC)   . Seizure disorder (HCC)   . Stroke (HCC)   . Unsteadiness on feet    Past Surgical History:  Procedure Laterality Date  . TOTAL HIP ARTHROPLASTY Left 10/11/2015   Procedure: TOTAL HIP ARTHROPLASTY ANTERIOR APPROACH;  Surgeon: Kathryne Hitch, MD;  Location: [facility] OR;  Service: Orthopedics;  Laterality: Left;  . TOTAL HIP REVISION Left 10/13/2015   Procedure:   REVISION OF LEFT HIP BALL;  Surgeon: Kathryne Hitch, MD;  Location: [facility] OR;  Service: Orthopedics;  Laterality: Left;   Social History:   reports that he has quit smoking. He does not have any smokeless tobacco history on file. He reports that he does not drink alcohol or use drugs.  Family History  Problem Relation Age of Onset  . Heart disease Other     unknown, patient says family members died long ago and he cannot remember    Medications:   Medication List  Accurate as of 04/30/16  3:03 PM. Always use your most recent med list.          baclofen 10 MG tablet Commonly known as:  LIORESAL Take 15 mg by mouth 3 (three) times daily. 1-1/2 tablets to = 15 mg   BIOFREEZE 4 % Gel Generic drug:  Menthol (Topical Analgesic) Apply 1 application topically daily.   citalopram 10 MG tablet Commonly known as:  CELEXA Take 10 mg by mouth daily.   haloperidol 5 MG tablet Commonly known as:  HALDOL Take 5 mg by mouth every 4 (four) hours as needed for  agitation.   haloperidol decanoate 50 MG/ML injection Commonly known as:  HALDOL DECANOATE Inject 50 mg into the muscle every 14 (fourteen) days.   hydrocerin Crea Apply 1 application topically daily. To bilateral legs and feet   methocarbamol 500 MG tablet Commonly known as:  ROBAXIN Take 500 mg by mouth every 8 (eight) hours as needed for muscle spasms.   ondansetron 4 MG tablet Commonly known as:  ZOFRAN Take 1 tablet (4 mg total) by mouth 2 (two) times daily. And may use 4 mg every 12 hours when necessary for nausea or vomiting.   oxycodone 5 MG capsule Commonly known as:  OXY-IR Take 5 mg by mouth every 12 (twelve) hours.   oxyCODONE 10 mg 12 hr tablet Commonly known as:  OXYCONTIN Take one tablet by mouth every 12 hours for pain. Do not crush   oxycodone 5 MG capsule Commonly known as:  OXY-IR Take 1-2 capsules (5-10 mg total) by mouth every 4 (four) hours as needed for pain. And take 1 tablet by mouth every 12 hours scheduled DO NOT CRUSH   pantoprazole 40 MG tablet Commonly known as:  PROTONIX Take 40 mg by mouth daily.   sennosides-docusate sodium 8.6-50 MG tablet Commonly known as:  SENOKOT-S Take 2 tablets by mouth 2 (two) times daily.   topiramate 50 MG tablet Commonly known as:  TOPAMAX Take 50 mg by mouth 2 (two) times daily.   UNABLE TO FIND Med Name: Med pass 120 mL by mouth 3 times daily   UNABLE TO FIND Med Name: Magic cup 2 times daily       Immunizations:  There is no immunization history on file for this patient.   Physical Exam: Vitals:   04/30/16 1454  BP: 98/64  Pulse: 96  Resp: 16  Temp: 97.6 F (36.4 C)  TempSrc: Oral  SpO2: 94%  Weight: 81 lb 12.8 oz (37.1 kg)  Height: 5\' 8"  (1.727 m)   Body mass index is 12.44 kg/m.  General- elderly frail male, thin built and emaciated, in no acute distress Head- normocephalic, atraumatic Nose- no nasal discharge Throat- moist mucus membrane, poor dentition Eyes- PERRLA, EOMI, no  pallor, no icterus Neck- no cervical lymphadenopathy Cardiovascular- normal s1,s2, no murmur, no leg edema Respiratory- bilateral clear to auscultation, no wheeze, no rhonchi, no crackles, no use of accessory muscles Abdomen- bowel sounds present, soft, non tender Musculoskeletal- able to move all 4 extremities,generalized weakness, right hand swelling and tender to touch, good radial pulse Skin- warm and dry Psychiatry- normal mood and affect    Labs reviewed: Basic Metabolic Panel:  Recent Labs  16/02/9604/22/17 0304  11/20/15 0219  11/20/15 0559  12/20/15 12/20/15 1623 12/21/15 0329  NA 130*  < > 136  < > 138  < > 144 138 138  K 3.8  < > 2.2*  < > 3.0*  < >  3.0* 2.5* 3.0*  CL 101  --  106  < > 104  --   --  111 110  CO2 22  --  24  --   --   --   --  20* 21*  GLUCOSE 90  --  101*  < > 93  --   --  139* 100*  BUN 11  < > 12  < > 8  < > 29* 28* 22*  CREATININE 0.90  < > 0.56*  < > 0.50*  < > 0.6 0.94 0.73  CALCIUM 8.1*  --  7.7*  --   --   --   --  8.4* 8.1*  MG 1.9  --   --   --   --   --   --   --  2.0  < > = values in this interval not displayed. Liver Function Tests:  Recent Labs  10/11/15 0950 11/19/15 12/20/15 1623  AST 53* 36 41  ALT 42 17 25  ALKPHOS 76 75 61  BILITOT 1.3*  --  0.5  PROT 6.7  --  6.3*  ALBUMIN 3.1*  --  2.7*   No results for input(s): LIPASE, AMYLASE in the last 8760 hours. No results for input(s): AMMONIA in the last 8760 hours. CBC:  Recent Labs  11/20/15 0219  12/19/15 12/20/15 1623 12/21/15 0329  WBC 4.3  --  6.3 11.0* 8.1  NEUTROABS 1.8  --  3 8.6*  --   HGB 10.8*  < > 9.3* 7.5* 8.4*  HCT 31.1*  < > 30* 22.8* 25.5*  MCV 94.8  --   --  97.4 95.5  PLT 193  --  286 243 215  < > = values in this interval not displayed. Cardiac Enzymes:  Recent Labs  12/20/15 1623  TROPONINI 0.04*   BNP: Invalid input(s): POCBNP CBG:  Recent Labs  12/20/15 1752  GLUCAP 126*    Radiological Exams: No results  found.    Assessment/Plan   Severe protein calorie malnutrition On magic cup and medpass supplement. Poor oral intake. Decline anticipated, monitor  Right hand swelling Appears to be from venous stasis with him keeping his right hand dangling from the bed. No signs of infection. Good radial pulse. Good joint mobility. Normal temperature. On oxycodone 5 mg bid and roxicodone 5 mg 1-2 tab q4h prn pain.   Seizure disorder Remains seizure free. Continue topiramate 50 mg bid and monitor  Advanced dementia Continue supportive care for now. Pressure ulcer prophylaxis. Nutritional support. Decline anticipated.   Bipolar disorder Stable mood. Continue citalopram and haldol.   Constipation  continue sennokot s 2 tab bid  gerd Continue protonix but decrease to 20 mg daily   Goals of care: long term care   Family/ staff Communication: reviewed care plan with patient and nursing supervisor    Oneal GroutMAHIMA Riyanna Crutchley, MD Internal Medicine [facility] [address] [place], [place] [address] Cell Phone (Monday-Friday 8 am - 5 pm): [phone] On Call: [phone] and follow prompts after 5 pm and on weekends Office Phone: [phone] Office Fax: [phone]

## 2016-05-06 ENCOUNTER — Other Ambulatory Visit: Payer: Self-pay | Admitting: *Deleted

## 2016-05-26 DEATH — deceased

## 2017-03-12 IMAGING — DX DG CHEST 2V
2 series · 2 of 2 positions shown · non-contrast
Comparison: 10/11/2015

CLINICAL DATA: Syncope, recurrent episodes while being transported
from bed to wheelchair in the rehab center he stays in, hx: CVA

EXAM:
CHEST  2 VIEW

[chest lat]
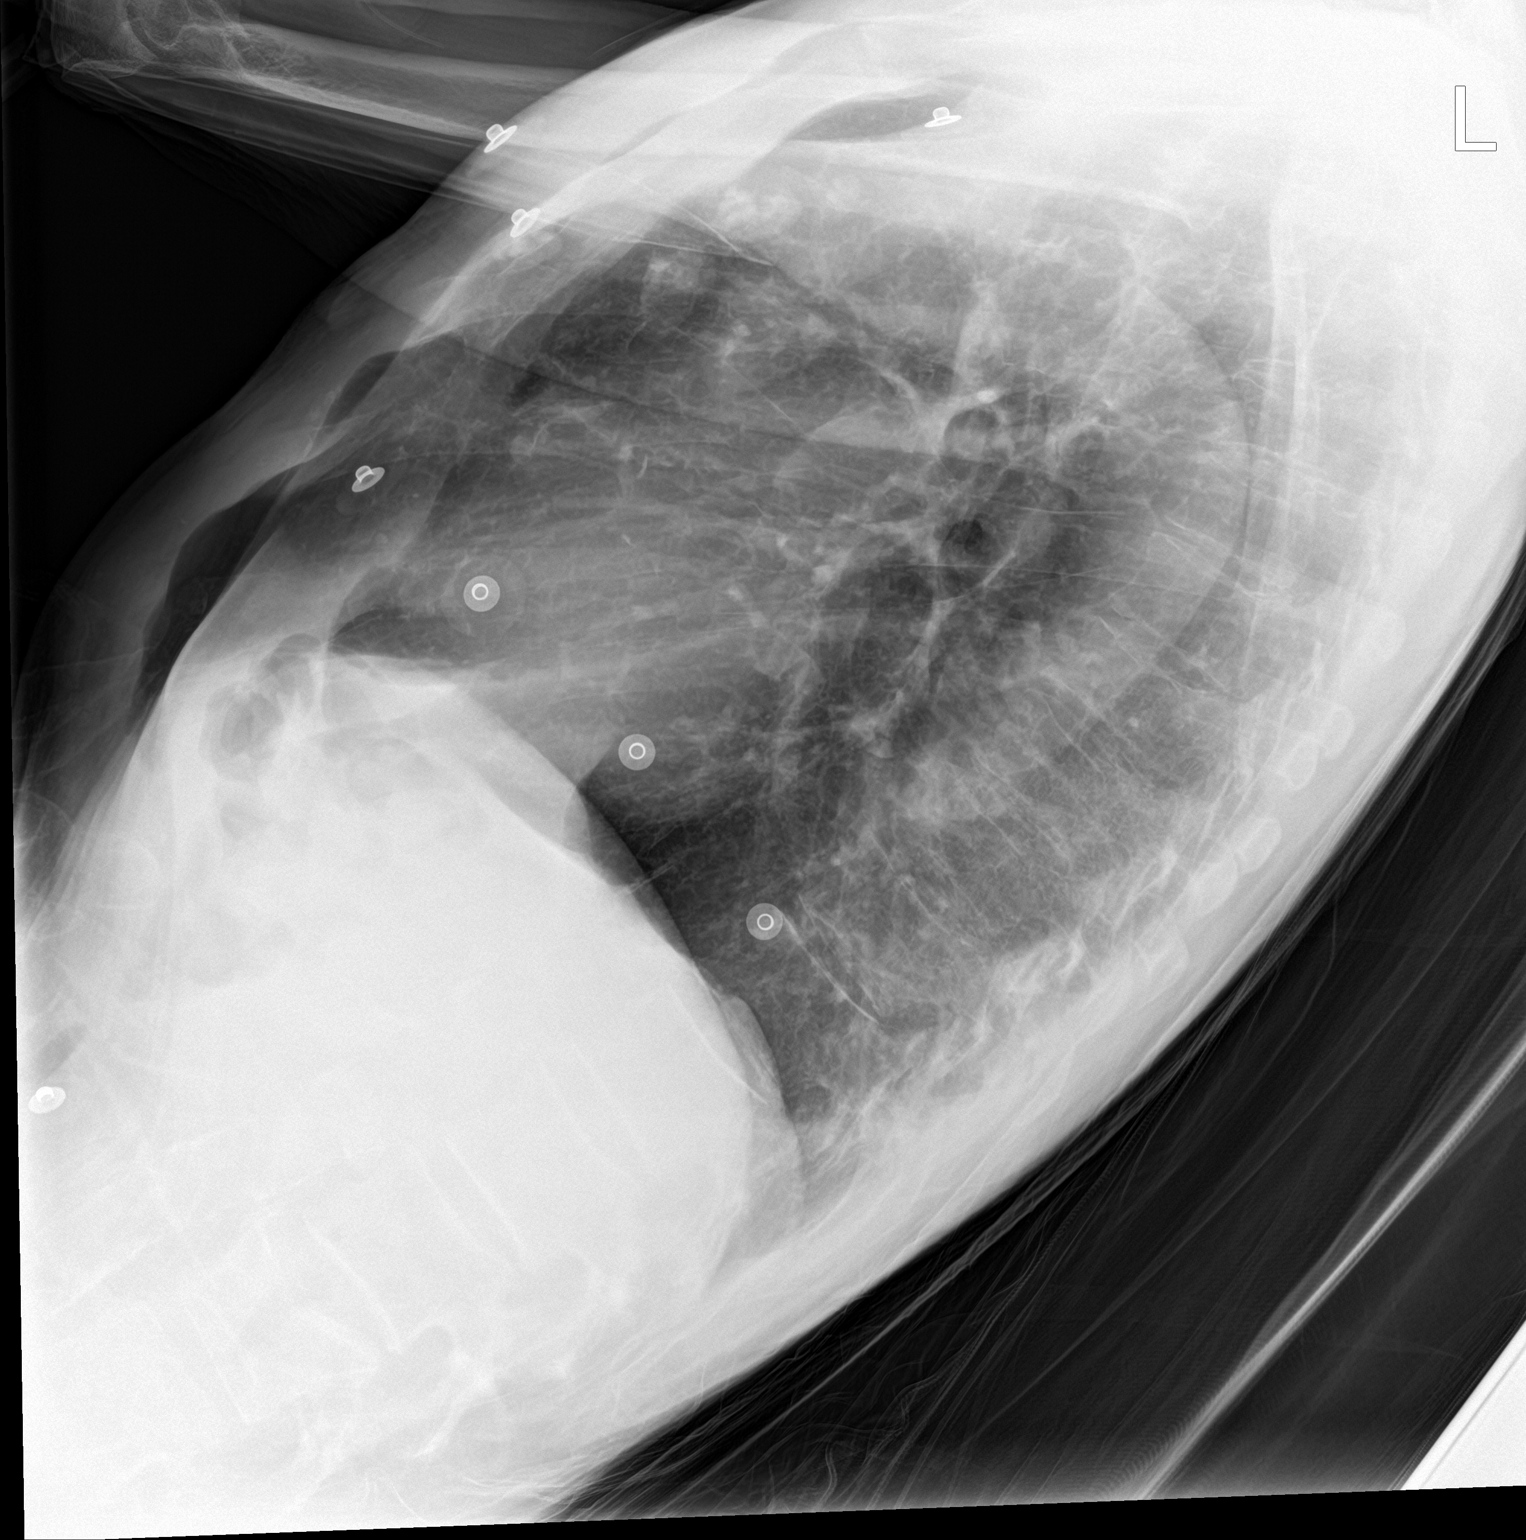

[chest ap]
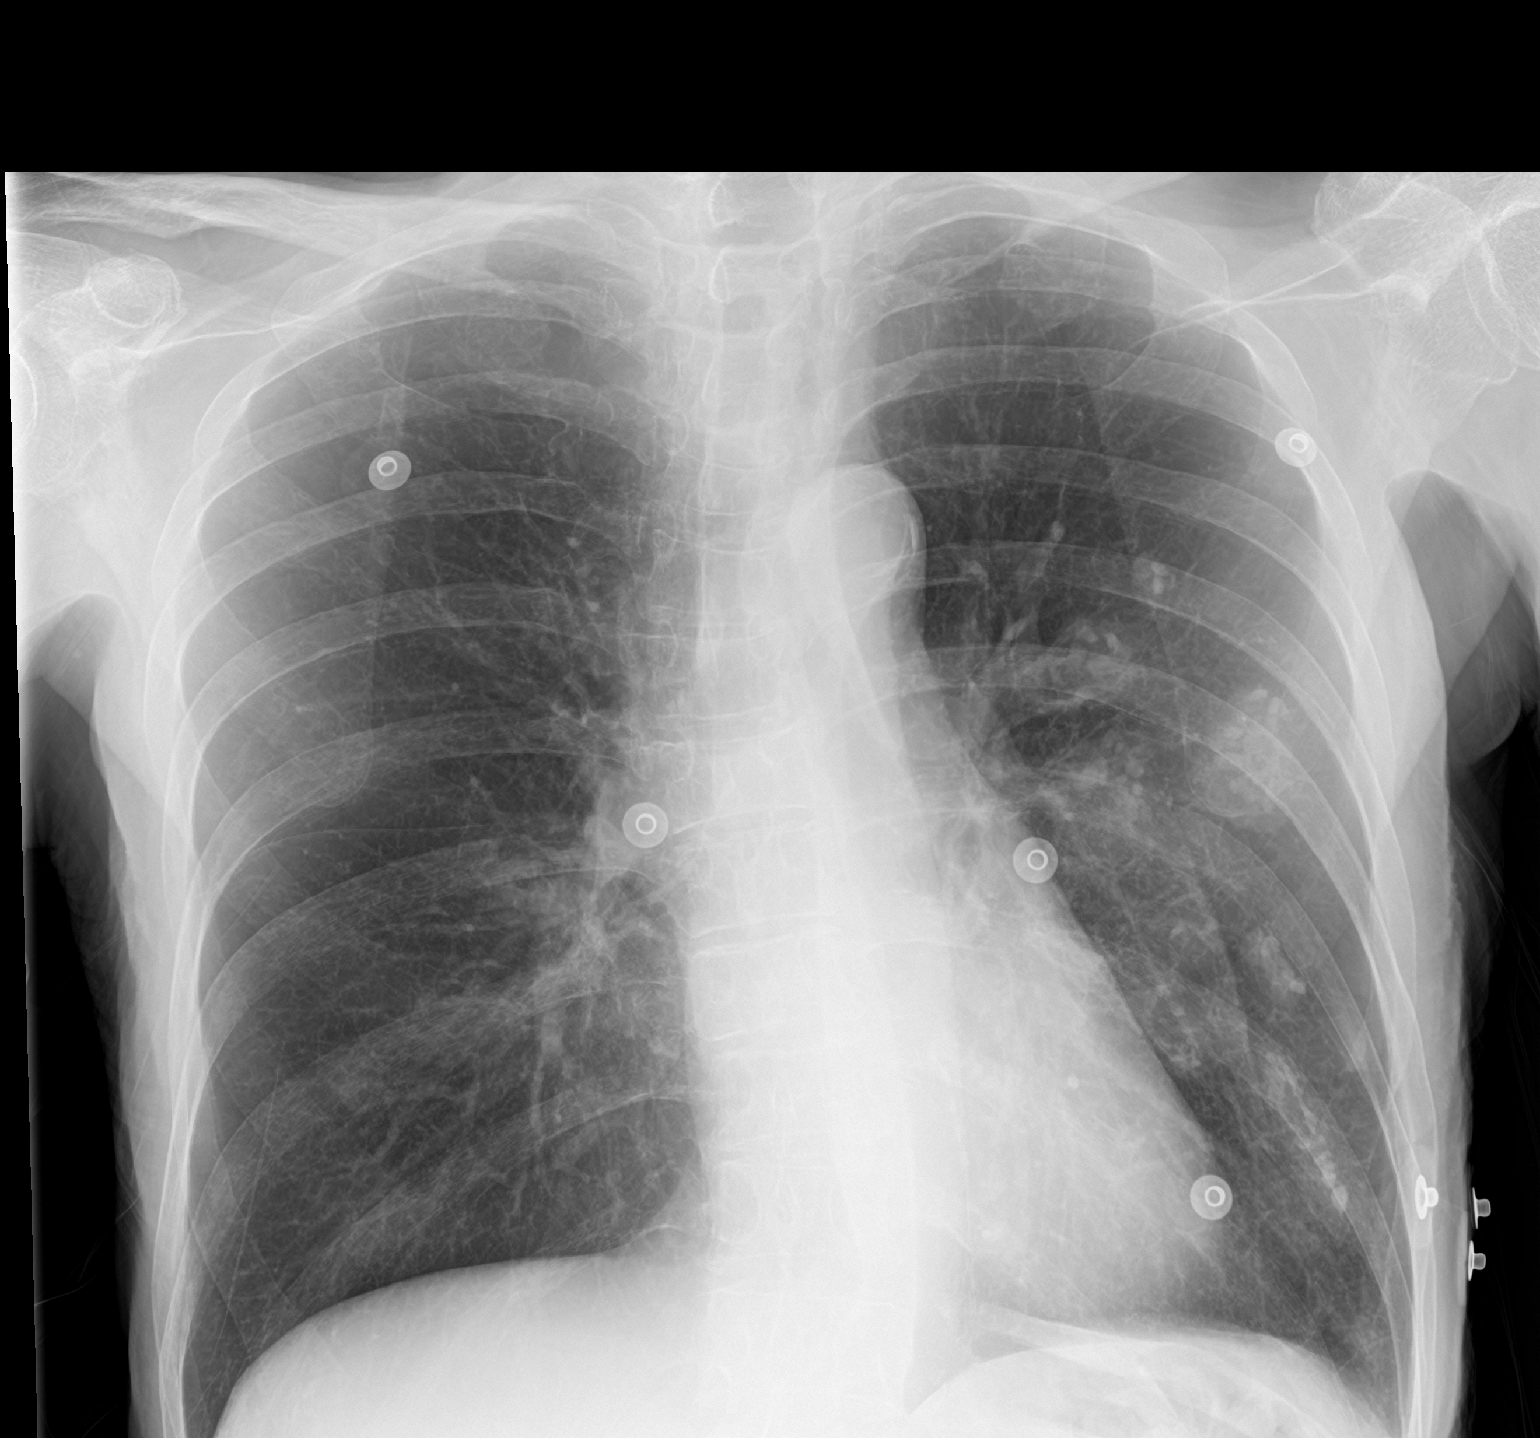

[2 of 2 positions shown; findings below may reference images not displayed]

FINDINGS: Cardiac silhouette is normal in size and configuration. No
mediastinal or hilar masses or evidence of adenopathy.

Multiple small calcifications noted in the left mid to lower lung,
stable. Lungs hyperexpanded but otherwise clear.

No pleural effusion or pneumothorax.

Skeletal structures are demineralized but grossly intact.
IMPRESSION: No acute cardiopulmonary disease. Stable appearance from the prior
study.
